# Patient Record
Sex: Male | Born: 1970 | Hispanic: Yes | Marital: Married | Smoking: Never smoker
Health system: Southern US, Community
[De-identification: ages and names within clinical notes are randomized; demographics above are authoritative.]

## PROBLEM LIST (undated history)

## (undated) DIAGNOSIS — E119 Type 2 diabetes mellitus without complications: Secondary | ICD-10-CM

## (undated) HISTORY — PX: FOREARM FRACTURE SURGERY: SHX649

---

## 2015-04-06 ENCOUNTER — Ambulatory Visit
Admission: EM | Admit: 2015-04-06 | Discharge: 2015-04-06 | Disposition: A | Discharge status: Home / Self Care | Payer: Self-pay | Attending: Family Medicine | Admitting: Family Medicine

## 2015-04-06 ENCOUNTER — Encounter: Payer: Self-pay | Admitting: *Deleted

## 2015-04-06 DIAGNOSIS — Z029 Encounter for administrative examinations, unspecified: Secondary | ICD-10-CM

## 2015-04-06 DIAGNOSIS — Z024 Encounter for examination for driving license: Secondary | ICD-10-CM

## 2015-04-06 HISTORY — DX: Type 2 diabetes mellitus without complications: E11.9

## 2015-04-06 LAB — DEPT OF TRANSP DIPSTICK, URINE (ARMC ONLY)
GLUCOSE, UA: NEGATIVE mg/dL
Protein, ur: NEGATIVE mg/dL
SPECIFIC GRAVITY, URINE: 1.025 (ref 1.005–1.030)

## 2015-04-06 NOTE — ED Provider Notes (Signed)
CSN: [id number]     Arrival date & time 04/06/15  1134 History   First MD Initiated Contact with Patient 04/06/15 1328     Chief Complaint  Patient presents with  . Commercial Driver's License Exam   (Consider location/radiation/quality/duration/timing/severity/associated sxs/prior Treatment) HPI   This a 45 year old Hispanic male speaks no English presents due to physical. 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 His entire physical exam in post physical briefing of 5 findings was performed through tele-interpreters. The first was at [facility] and a post examination with Ricki Rodriguez [phone]. During the interview the patient stated that he was on diabetes not because he was diabetic but as a preventative medication initially by his physician. He is currently listed at 500 mg twice a day with a meal. I questioned the need to take have this physical done he has no income but he was adamant that he wanted to proceed. He did not want to have blood work because the additional cost but will not must have an A1c in order to see if the metformin is working. He states that he has no other medical problems has had no surgeries.     Past Medical History  Diagnosis Date  . Diabetes mellitus without complication [facility])    Past Surgical History  Procedure Laterality Date  . Forearm fracture surgery Left    History reviewed. No pertinent family history. Social History  Substance Use Topics  . Smoking status: Never Smoker   . Smokeless tobacco: None  . Alcohol Use: No    Review of Systems  All other systems reviewed and are negative.   Allergies  Review of patient's allergies indicates no known allergies.  Home Medications   Prior to Admission medications   Medication Sig Start Date End Date Taking? Authorizing Provider  metFORMIN (GLUCOPHAGE) 500 MG tablet Take 500 mg by mouth 2 (two) times daily with a meal.    Yes Historical Provider, MD   Meds Ordered and Administered this Visit  Medications - No data to display  BP 118/76 mmHg  Pulse 78  Temp(Src) 98.5 F (36.9 C) (Oral)  Resp 16  Ht  (1.702 m)  Wt 264 lb (119.75 kg)  BMI 41.34 kg/m2  SpO2 99% No data found.   Physical Exam  ED Course  Procedures (including critical care time)  Labs Review Labs Reviewed  DEPT OF TRANSP DIPSTICK, URINE([facility] ONLY) - Abnormal; Notable for the following:    Hgb urine dipstick TRACE (*)    All other components within normal limits  HEMOGLOBIN A1C    Imaging Review No results found.   Visual Acuity Review  Right Eye Distance:      Left Eye Distance:   Bilateral Distance:    Right Eye Near:   Left Eye Near:    Bilateral Near:         MDM   1. Driver's permit physical examination    I discussed through the interpreter  to this patient that he needs to have verification that his physician is taking care of him and his diabetes is under control. I have drawn an A1c today which will not be resulted until tomorrow and because of this I have issued a 3 month certificate in order to allow him to get verification from his physician that he is under that physician's care and that his diabetes is under control. I've advised the patient  that he may consider taking up to 3 months to allow him to become employed and therefore be able to pay for the exam when he returns. He understands that there will be a certificate only for 1 year since he is on medication at the time. He fully understands the rationale behind the 3 month certificate and the need for verification that he be Evaluated for the diabetes.    Lutricia FeilWilliam P Delorice Bannister, PA-C 04/06/15 1528  Lutricia FeilWilliam P Lavayah Vita, PA-C 04/06/15 2144  Lutricia FeilWilliam P Shambria Camerer, PA-C 04/06/15 2154

## 2015-04-06 NOTE — ED Notes (Signed)
DOT physical. 

## 2015-04-07 LAB — HEMOGLOBIN A1C: Hgb A1c MFr Bld: 5.6 % (ref 4.0–6.0)

## 2018-06-22 ENCOUNTER — Other Ambulatory Visit: Payer: Self-pay

## 2018-06-22 ENCOUNTER — Encounter: Payer: Self-pay | Admitting: Emergency Medicine

## 2018-06-22 ENCOUNTER — Emergency Department: Payer: HRSA Program

## 2018-06-22 ENCOUNTER — Inpatient Hospital Stay (HOSPITAL_COMMUNITY)
Admission: AD | Admit: 2018-06-22 | Discharge: 2018-06-25 | DRG: 177 | Disposition: A | Discharge status: Home / Self Care | Payer: BC Managed Care – PPO | Source: Other Acute Inpatient Hospital | Attending: Internal Medicine | Admitting: Internal Medicine

## 2018-06-22 ENCOUNTER — Emergency Department
Admission: EM | Admit: 2018-06-22 | Discharge: 2018-06-22 | Disposition: A | Discharge status: Other Acute Inpatient Hospital | Payer: HRSA Program | Attending: Emergency Medicine | Admitting: Emergency Medicine

## 2018-06-22 DIAGNOSIS — E119 Type 2 diabetes mellitus without complications: Secondary | ICD-10-CM | POA: Insufficient documentation

## 2018-06-22 DIAGNOSIS — U071 COVID-19: Secondary | ICD-10-CM

## 2018-06-22 DIAGNOSIS — Z6841 Body Mass Index (BMI) 40.0 and over, adult: Secondary | ICD-10-CM

## 2018-06-22 DIAGNOSIS — B349 Viral infection, unspecified: Secondary | ICD-10-CM | POA: Diagnosis present

## 2018-06-22 DIAGNOSIS — E66813 Obesity, class 3: Secondary | ICD-10-CM | POA: Diagnosis present

## 2018-06-22 DIAGNOSIS — J9601 Acute respiratory failure with hypoxia: Secondary | ICD-10-CM | POA: Diagnosis present

## 2018-06-22 DIAGNOSIS — R05 Cough: Secondary | ICD-10-CM

## 2018-06-22 DIAGNOSIS — R0602 Shortness of breath: Secondary | ICD-10-CM

## 2018-06-22 DIAGNOSIS — T380X5A Adverse effect of glucocorticoids and synthetic analogues, initial encounter: Secondary | ICD-10-CM | POA: Diagnosis present

## 2018-06-22 DIAGNOSIS — R740 Nonspecific elevation of levels of transaminase and lactic acid dehydrogenase [LDH]: Secondary | ICD-10-CM | POA: Diagnosis present

## 2018-06-22 DIAGNOSIS — R058 Other specified cough: Secondary | ICD-10-CM

## 2018-06-22 DIAGNOSIS — J069 Acute upper respiratory infection, unspecified: Secondary | ICD-10-CM | POA: Diagnosis not present

## 2018-06-22 DIAGNOSIS — R5383 Other fatigue: Secondary | ICD-10-CM

## 2018-06-22 DIAGNOSIS — R509 Fever, unspecified: Secondary | ICD-10-CM

## 2018-06-22 DIAGNOSIS — Z7984 Long term (current) use of oral hypoglycemic drugs: Secondary | ICD-10-CM | POA: Insufficient documentation

## 2018-06-22 LAB — CBC
HCT: 47.8 % (ref 39.0–52.0)
Hemoglobin: 16.4 g/dL (ref 13.0–17.0)
MCH: 28.9 pg (ref 26.0–34.0)
MCHC: 34.3 g/dL (ref 30.0–36.0)
MCV: 84.3 fL (ref 80.0–100.0)
Platelets: 226 10*3/uL (ref 150–400)
RBC: 5.67 MIL/uL (ref 4.22–5.81)
RDW: 12 % (ref 11.5–15.5)
WBC: 5.5 10*3/uL (ref 4.0–10.5)
nRBC: 0 % (ref 0.0–0.2)

## 2018-06-22 LAB — BASIC METABOLIC PANEL
Anion gap: 13 (ref 5–15)
BUN: 13 mg/dL (ref 6–20)
CO2: 20 mmol/L — ABNORMAL LOW (ref 22–32)
Calcium: 8.5 mg/dL — ABNORMAL LOW (ref 8.9–10.3)
Chloride: 101 mmol/L (ref 98–111)
Creatinine, Ser: 0.84 mg/dL (ref 0.61–1.24)
GFR calc Af Amer: >60 mL/min (ref >60)
GFR calc non Af Amer: >60 mL/min (ref >60)
Glucose, Bld: 199 mg/dL — ABNORMAL HIGH (ref 70–99)
Potassium: 3.7 mmol/L (ref 3.5–5.1)
Sodium: 134 mmol/L — ABNORMAL LOW (ref 135–145)

## 2018-06-22 LAB — GLUCOSE, CAPILLARY: Glucose-Capillary: 165 mg/dL — ABNORMAL HIGH (ref 70–99)

## 2018-06-22 LAB — PROCALCITONIN: Procalcitonin: <0.1 ng/mL

## 2018-06-22 LAB — LACTATE DEHYDROGENASE: LDH: 256 U/L — ABNORMAL HIGH (ref 98–192)

## 2018-06-22 LAB — SARS CORONAVIRUS 2 BY RT PCR (HOSPITAL ORDER, PERFORMED IN ~~LOC~~ HOSPITAL LAB): SARS Coronavirus 2: POSITIVE — AB

## 2018-06-22 LAB — FERRITIN: Ferritin: 1415 ng/mL — ABNORMAL HIGH (ref 24–336)

## 2018-06-22 LAB — C-REACTIVE PROTEIN: CRP: 8.7 mg/dL — ABNORMAL HIGH (ref <1.0)

## 2018-06-22 LAB — FIBRIN DERIVATIVES D-DIMER (ARMC ONLY): Fibrin derivatives D-dimer (ARMC): 511.97 ng/mL (FEU) — ABNORMAL HIGH (ref 0.00–499.00)

## 2018-06-22 MED ORDER — INSULIN ASPART 100 UNIT/ML ~~LOC~~ SOLN
0.0000 [IU] | Freq: Three times a day (TID) | SUBCUTANEOUS | Status: DC
  Administered 2018-06-23 (×3): 5 [IU] via SUBCUTANEOUS
  Administered 2018-06-24 (×2): 7 [IU] via SUBCUTANEOUS
  Administered 2018-06-24 – 2018-06-25 (×2): 3 [IU] via SUBCUTANEOUS

## 2018-06-22 MED ORDER — INSULIN ASPART 100 UNIT/ML ~~LOC~~ SOLN
0.0000 [IU] | Freq: Every day | SUBCUTANEOUS | Status: DC
  Administered 2018-06-23 – 2018-06-24 (×2): 3 [IU] via SUBCUTANEOUS

## 2018-06-22 MED ORDER — POLYETHYLENE GLYCOL 3350 17 G PO PACK: 17.0000 g | PACK | Freq: Every day | ORAL | Status: DC | PRN

## 2018-06-22 MED ORDER — ACETAMINOPHEN 500 MG PO TABS
1000.0000 mg | ORAL_TABLET | Freq: Once | ORAL | Status: AC
  Administered 2018-06-22: 1000 mg via ORAL
  Filled 2018-06-22: qty 2

## 2018-06-22 MED ORDER — ONDANSETRON HCL 4 MG/2ML IJ SOLN: 4.0000 mg | Freq: Four times a day (QID) | INTRAMUSCULAR | Status: DC | PRN

## 2018-06-22 MED ORDER — ACETAMINOPHEN 325 MG PO TABS: 650.0000 mg | ORAL_TABLET | Freq: Four times a day (QID) | ORAL | Status: DC | PRN

## 2018-06-22 MED ORDER — ENOXAPARIN SODIUM 120 MG/0.8ML ~~LOC~~ SOLN
120.0000 mg | Freq: Two times a day (BID) | SUBCUTANEOUS | Status: DC
  Administered 2018-06-22 – 2018-06-23 (×2): 120 mg via SUBCUTANEOUS
  Filled 2018-06-22 (×2): qty 0.8

## 2018-06-22 MED ORDER — SODIUM CHLORIDE 0.9 % IV BOLUS
500.0000 mL | Freq: Once | INTRAVENOUS | Status: AC
  Administered 2018-06-22: 13:00:00 500 mL via INTRAVENOUS

## 2018-06-22 MED ORDER — ONDANSETRON HCL 4 MG PO TABS: 4.0000 mg | ORAL_TABLET | Freq: Four times a day (QID) | ORAL | Status: DC | PRN

## 2018-06-22 NOTE — ED Provider Notes (Signed)
[facility] EMERGENCY DEPARTMENT Provider Note   CSN: [id number] Arrival date & time: 06/22/18  1210    History   Chief Complaint Chief Complaint  Patient presents with  . Weakness  . Shortness of Breath    HPI Brian Robertson is a 48 y.o. male presents to the emergency department evaluation of persistent fever cough and difficulty breathing.  Patient states he was diagnosed with COVID 16 days ago.  His wife and son were also diagnosed with COVID and their symptoms have resolved.  He is concerned because he is continuing to have symptoms.  Patient has had persistent subjective fevers.  He is taken Tylenol and ibuprofen last dose of medication was last night he took Tylenol around 11 PM.  Patient has had dry nonproductive cough, denies any wheezing.  He denies any abdominal pain, chest pain.  Patient's cough shortness of breath seem to be increasing. HPI  Past Medical History:  Diagnosis Date  . Diabetes mellitus without complication (HCC)     There are no active problems to display for this patient.   Past Surgical History:  Procedure Laterality Date  . FOREARM FRACTURE SURGERY Left         Home Medications    Prior to Admission medications   Medication Sig Start Date End Date Taking? Authorizing Provider  metFORMIN (GLUCOPHAGE) 500 MG tablet Take 500 mg by mouth 2 (two) times daily with a meal.    [provider]    Family History History reviewed. No pertinent family history.  Social History Social History   Tobacco Use  . Smoking status: Never Smoker  . Smokeless tobacco: Never Used  Substance Use Topics  . Alcohol use: No  . Drug use: Never     Allergies   Patient has no known allergies.   Review of Systems Review of Systems  Constitutional: Positive for fatigue and fever.  HENT: Negative for sore throat and trouble swallowing.   Respiratory: Positive for cough and shortness of breath. Negative for chest  tightness and wheezing.   Cardiovascular: Negative for chest pain.  Gastrointestinal: Negative for abdominal pain, diarrhea, nausea and vomiting.  Genitourinary: Negative for dysuria.  Musculoskeletal: Negative for arthralgias, back pain, gait problem and joint swelling.  Skin: Negative for rash and wound.  Neurological: Negative for dizziness, light-headedness and headaches.     Physical Exam Updated Vital Signs BP 114/69   Pulse (!) 101   Temp (!) 100.7 F (38.2 C) (Oral)   Resp 18   Ht 5\' 7"  (1.702 m)   Wt 124.7 kg   SpO2 94%   BMI 43.07 kg/m   Physical Exam Constitutional:      Appearance: He is well-developed.  HENT:     Head: Normocephalic and atraumatic.  Eyes:     Conjunctiva/sclera: Conjunctivae normal.  Neck:     Musculoskeletal: Normal range of motion.  Cardiovascular:     Rate and Rhythm: Tachycardia present.     Pulses: Normal pulses.     Heart sounds: Normal heart sounds.  Pulmonary:     Effort: Pulmonary effort is normal. Tachypnea present.     Breath sounds: Examination of the right-upper field reveals decreased breath sounds. Examination of the left-upper field reveals decreased breath sounds. Examination of the right-middle field reveals decreased breath sounds. Examination of the left-middle field reveals decreased breath sounds. Examination of the right-lower field reveals decreased breath sounds. Examination of the left-lower field reveals decreased breath sounds. Decreased breath sounds present.  No wheezing, rhonchi or rales.  Chest:     Chest wall: No tenderness.  Abdominal:     General: There is no distension.     Tenderness: There is no abdominal tenderness. There is no guarding.  Musculoskeletal: Normal range of motion.  Skin:    General: Skin is warm.     Findings: No rash.  Neurological:     Mental Status: He is alert and oriented to person, place, and time.  Psychiatric:        Behavior: Behavior normal.        Thought Content: Thought  content normal.      ED Treatments / Results  Labs (all labs ordered are listed, but only abnormal results are displayed) Labs Reviewed  SARS CORONAVIRUS 2 (HOSPITAL ORDER, PERFORMED IN [facility]) - Abnormal; Notable for the following components:      Result Value   SARS Coronavirus 2 POSITIVE (*)    All other components within normal limits  BASIC METABOLIC PANEL - Abnormal; Notable for the following components:   Sodium 134 (*)    CO2 20 (*)    Glucose, Bld 199 (*)    Calcium 8.5 (*)    All other components within normal limits  CBC  PROCALCITONIN    EKG None  Radiology Dg Chest 1 View  Result Date: 06/22/2018 CLINICAL DATA:  Difficulty breathing, shortness of breath EXAM: CHEST  1 VIEW COMPARISON:  None. FINDINGS: Low lung volumes with mid and lower lung ill-defined and streaky mixed interstitial/airspace opacities. Minor right base atelectasis. Negative for edema, effusion or pneumothorax. Trachea midline. No significant or acute osseous finding. IMPRESSION: Bilateral mid and lower lung streaky mixed interstitial and airspace opacities compatible with pneumonia/atypical infection. Right base atelectasis. Electronically Signed   By: Judie PetitM.  Shick M.D.   On: 06/22/2018 13:41    Procedures Procedures (including critical care time)  Medications Ordered in ED Medications  acetaminophen (TYLENOL) tablet 1,000 mg (1,000 mg Oral Given 06/22/18 1317)  sodium chloride 0.9 % bolus 500 mL (0 mLs Intravenous Stopped 06/22/18 1443)     Initial Impression / Assessment and Plan / ED Course  I have reviewed the triage vital signs and the nursing notes.  Pertinent labs & imaging results that were available during my care of the patient were reviewed by me and considered in my medical decision making (see chart for details).        48 year old male with positive coded who states he was +16 days ago as well.  He has had persistent cough shortness of breath with fevers.   Patient hypoxic with ambulation.  X-ray showing bilateral diffuse infiltrates.  [facility] and [facility] for observation of hypoxia secondary to COVID  Final Clinical Impressions(s) / ED Diagnoses   Final diagnoses:  COVID-19 virus infection  Fatigue, unspecified type  Fever, unspecified fever cause  Dry cough    ED Discharge Orders    None       Ronnette JuniperGaines, Acxel Dingee C, PA-C 06/22/18 1714    Sharyn CreamerQuale, Mark, MD 07/02/18 2344

## 2018-06-22 NOTE — ED Notes (Signed)
Date and time results received: 06/22/18 2:39 PM   Test: Covid Critical Value: Pos  Name of Provider Notified: Floyce Stakes, [place]

## 2018-06-22 NOTE — ED Notes (Signed)
This tech ambulated with pt in room. While ambulating pts SpO2 decreased to 91%, pt began coughing and wheezing. Pt returned to bed. Will continue to monitor.

## 2018-06-22 NOTE — ED Provider Notes (Signed)
Medical screening examination/treatment/procedure(s) were conducted as a shared visit with non-physician practitioner(s) and myself.  I personally evaluated the patient during the encounter.    Personally saw and evaluate the patient.  He is slightly tachycardic at rest.  Febrile.  With ambulation his pulse oximetry decreases to 91%.  He has some mild tachypnea when at rest, no wheezing.  I am concerned that the patient has bilateral infiltrates, slight tachycardia at rest, developing hypoxia with ambulation.  Discussed with the patient via the interpreter, also saw the patient myself, staffed also with our PA Cranston Neighbor.  We will consult with the [facility] hospitalist, plan for admission to [facility] for COVID-19 is the patient status seems to be ongoing will now developing borderline hypoxia, tachycardia, and I am concerned that he could develop significant hypoxia over the next few days time.  Ongoing care assigned to Dr. Scotty Court and also continuing care with PA Evalee Mutton, Loraine Leriche, MD 06/22/18 [phone]

## 2018-06-22 NOTE — ED Triage Notes (Signed)
Pt to ER from home with reports of positive Covid last week.  Reports he is having increased difficulty breathing.  States symptoms are much the same as the last several days.

## 2018-06-22 NOTE — ED Notes (Signed)
emtala reviewed by this RN 

## 2018-06-23 LAB — RESPIRATORY PANEL BY PCR

## 2018-06-23 LAB — HEPATIC FUNCTION PANEL
ALT: 95 U/L — ABNORMAL HIGH (ref 0–44)
AST: 54 U/L — ABNORMAL HIGH (ref 15–41)
Albumin: 3.3 g/dL — ABNORMAL LOW (ref 3.5–5.0)
Alkaline Phosphatase: 67 U/L (ref 38–126)
Bilirubin, Direct: 0.2 mg/dL (ref 0.0–0.2)
Indirect Bilirubin: 0.3 mg/dL (ref 0.3–0.9)
Total Bilirubin: 0.5 mg/dL (ref 0.3–1.2)
Total Protein: 7.3 g/dL (ref 6.5–8.1)

## 2018-06-23 LAB — ABO/RH: ABO/RH(D): O POS

## 2018-06-23 LAB — GLUCOSE, CAPILLARY
Glucose-Capillary: 259 mg/dL — ABNORMAL HIGH (ref 70–99)
Glucose-Capillary: 298 mg/dL — ABNORMAL HIGH (ref 70–99)
Glucose-Capillary: 287 mg/dL — ABNORMAL HIGH (ref 70–99)
Glucose-Capillary: 274 mg/dL — ABNORMAL HIGH (ref 70–99)

## 2018-06-23 LAB — D-DIMER, QUANTITATIVE: D-Dimer, Quant: 0.27 ug/mL-FEU (ref 0.00–0.50)

## 2018-06-23 LAB — HEMOGLOBIN A1C
Hgb A1c MFr Bld: 6.9 % — ABNORMAL HIGH (ref 4.8–5.6)
Mean Plasma Glucose: 151.33 mg/dL

## 2018-06-23 MED ORDER — ZINC SULFATE 220 (50 ZN) MG PO CAPS
220.0000 mg | ORAL_CAPSULE | Freq: Every day | ORAL | Status: DC
  Administered 2018-06-23 – 2018-06-25 (×3): 220 mg via ORAL
  Filled 2018-06-23 (×3): qty 1

## 2018-06-23 MED ORDER — BENZONATATE 100 MG PO CAPS
100.0000 mg | ORAL_CAPSULE | Freq: Three times a day (TID) | ORAL | Status: DC
  Administered 2018-06-23 – 2018-06-25 (×7): 100 mg via ORAL
  Filled 2018-06-23 (×7): qty 1

## 2018-06-23 MED ORDER — TOCILIZUMAB 400 MG/20ML IV SOLN
800.0000 mg | Freq: Once | INTRAVENOUS | Status: AC
  Administered 2018-06-23: 800 mg via INTRAVENOUS
  Filled 2018-06-23: qty 40

## 2018-06-23 MED ORDER — GUAIFENESIN 100 MG/5ML PO SOLN
5.0000 mL | ORAL | Status: DC | PRN
  Administered 2018-06-24: 100 mg via ORAL
  Filled 2018-06-23: qty 15

## 2018-06-23 MED ORDER — VITAMIN C 500 MG PO TABS
500.0000 mg | ORAL_TABLET | Freq: Every day | ORAL | Status: DC
  Administered 2018-06-23 – 2018-06-25 (×3): 500 mg via ORAL
  Filled 2018-06-23 (×3): qty 1

## 2018-06-23 MED ORDER — ENOXAPARIN SODIUM 60 MG/0.6ML ~~LOC~~ SOLN
60.0000 mg | SUBCUTANEOUS | Status: DC
  Administered 2018-06-24 – 2018-06-25 (×2): 60 mg via SUBCUTANEOUS
  Filled 2018-06-23 (×2): qty 0.6

## 2018-06-23 MED ORDER — METHYLPREDNISOLONE SODIUM SUCC 125 MG IJ SOLR
80.0000 mg | Freq: Three times a day (TID) | INTRAMUSCULAR | Status: DC
  Administered 2018-06-23 – 2018-06-24 (×5): 80 mg via INTRAVENOUS
  Filled 2018-06-23 (×5): qty 2

## 2018-06-23 MED ORDER — FAMOTIDINE 20 MG PO TABS
20.0000 mg | ORAL_TABLET | Freq: Every day | ORAL | Status: DC
  Administered 2018-06-23 – 2018-06-25 (×3): 20 mg via ORAL
  Filled 2018-06-23 (×3): qty 1

## 2018-06-23 NOTE — Progress Notes (Signed)
PROGRESS NOTE  Brian Robertson MMC:[id number] DOB: August 15, 1970 DOA: 06/22/2018  [facility]  Brief History/Interval Summary: 48 y.o. male with medical history significant for morbid obesity and recently diagnosed diabetes mellitus (who had not yet started on medication) who was diagnosed over a week ago with coronavirus along with the rest of his family.    He initially was thought to be improving but then his symptoms worsened he came to the emergency department.  He was noted to be mildly hypoxic.  He was hospitalized for further management.   Reason for Visit: Acute respiratory disease due to COVID-19  Consultants: None  Procedures: None  Antibiotics: None  Subjective/Interval History: Interpreter services were utilized to communicate with the patient.  Patient states that he continues to have a cough.  Continues to have shortness of breath though slightly better today compared to yesterday.  Denies any nausea vomiting.    Assessment/Plan:  Acute Hypoxic Resp. Failure due to Acute Covid 19 Viral Illness  COVID-19 Labs  Recent Labs    06/22/18 1737 06/23/18 0324  DDIMER  --  0.27  FERRITIN 1,415*  --   LDH 256*  --   CRP 8.7*  --     Lab Results  Component Value Date   SARSCOV2NAA POSITIVE (A) 06/22/2018     Fever: Had fever yesterday with a temperature of 100.7.  Afebrile since then. Oxygen requirements: 2 L of oxygen by nasal cannula.  Saturating in the 90s. Antibiotics: None Remdesivir: Not indicated due to prolonged course of illness Steroids: Solu-Medrol 80 mg every 8 hours Diuretics: Not ordered yet Actemra: Received 1 dose on 5/30. Convalescent Plasma: Not given yet Vitamin C and Zinc: Will be initiated DVT Prophylaxis:  Lovenox 60 mg once daily  Patient's respiratory status appears to be stable.  His CRP was 8.7 yesterday evening.  He was given a dose of Actemra and placed on steroids.  We will recheck his inflammatory  markers tomorrow.  Hold off on further doses of Actemra for now.  Continue steroids.  Incentive spirometry.  Prone positioning as much as possible.  Mobilization.  The treatment plan and use of medications and known side effects were discussed with patient. It was clearly explained that there is no proven definitive treatment for COVID-19 infection yet. Any medications used here are based on case reports/anecdotal data which are not peer-reviewed and has not been studied using randomized control trials.  Complete risks and long-term side effects are unknown, however in the best clinical judgment they seem to be of some clinical benefit rather than medical risks.  Patient agreed with the treatment plan and want to receive these treatments as indicated.  She was given steroids and Actemra.  Diabetes mellitus type 2 Recently diagnosed.  Has not started taking metformin yet.  HbA1c is pending.  SSI.  Morbid obesity BMI 43.07.   DVT Prophylaxis: Lovenox PUD Prophylaxis: Will initiate Pepcid Code Status: Full code Family Communication: Discussed with the patient Disposition Plan: Management as outlined above.  Will likely return home when improved.   Medications:  Scheduled: . [START ON 06/24/2018] enoxaparin (LOVENOX) injection  60 mg Subcutaneous Q24H  . insulin aspart  0-5 Units Subcutaneous QHS  . insulin aspart  0-9 Units Subcutaneous TID WC  . methylPREDNISolone (SOLU-MEDROL) injection  80 mg Intravenous Q8H   Continuous:  PCH:EKBTCYELYHTMB, ondansetron **OR** ondansetron (ZOFRAN) IV, polyethylene glycol   Objective:  Vital Signs  Vitals:   06/23/18 3112 06/23/18 1624 06/23/18 0750  06/23/18 0812  BP: (!) 134/97 (!) 123/94    Pulse: 97 90 95   Resp: (!) 33 (!) 24 (!) 25   Temp: 99.4 F (37.4 C) 98.2 F (36.8 C)  98.6 F (37 C)  TempSrc: Oral Oral  Oral  SpO2:   94%     Intake/Output Summary (Last 24 hours) at 06/23/2018 1132 Last data filed at 06/23/2018 0750 Gross per 24  hour  Intake 100 ml  Output 750 ml  Net -650 ml   There were no vitals filed for this visit.  General appearance: Awake alert.  In no distress.  Morbidly obese Resp: Mildly tachypneic at rest.  Crackles at the bases.  No wheezing or rhonchi. Cardio: S1-S2 is normal regular.  No S3-S4.  No rubs murmurs or bruit GI: Abdomen is soft.  Nontender nondistended.  Bowel sounds are present normal.  No masses organomegaly Extremities: No edema.  Full range of motion of lower extremities. Neurologic: Alert and oriented x3.  No focal neurological deficits.    Lab Results:  Data Reviewed: I have personally reviewed following labs and imaging studies  CBC: Recent Labs  Lab 06/22/18 1241  WBC 5.5  HGB 16.4  HCT 47.8  MCV 84.3  PLT 226    Basic Metabolic Panel: Recent Labs  Lab 06/22/18 1241  NA 134*  K 3.7  CL 101  CO2 20*  GLUCOSE 199*  BUN 13  CREATININE 0.84  CALCIUM 8.5*    GFR: Estimated Creatinine Clearance: 137.6 mL/min (by C-G formula based on SCr of 0.84 mg/dL).  Liver Function Tests: Recent Labs  Lab 06/23/18 0324  AST 54*  ALT 95*  ALKPHOS 67  BILITOT 0.5  PROT 7.3  ALBUMIN 3.3*    CBG: Recent Labs  Lab 06/22/18 2231 06/23/18 0807  GLUCAP 165* 259*    Anemia Panel: Recent Labs    06/22/18 1737  FERRITIN 1,415*    Recent Results (from the past 240 hour(s))  SARS Coronavirus 2 (CEPHEID- Performed in [facility] hospital lab), Hosp Order     Status: Abnormal   Collection Time: 06/22/18  1:19 PM  Result Value Ref Range Status   SARS Coronavirus 2 POSITIVE (A) NEGATIVE Final    Comment: RESULT CALLED TO, READ BACK BY AND VERIFIED WITH: JENNIFER WHITLEY AT 1425 ON 06/22/2018 [facility]. (NOTE) If result is NEGATIVE SARS-CoV-2 target nucleic acids are NOT DETECTED. The SARS-CoV-2 RNA is generally detectable in upper and lower  respiratory specimens during the acute phase of infection. The lowest  concentration of SARS-CoV-2 viral copies this assay  can detect is 250  copies / mL. A negative result does not preclude SARS-CoV-2 infection  and should not be used as the sole basis for treatment or other  patient management decisions.  A negative result may occur with  improper specimen collection / handling, submission of specimen other  than nasopharyngeal swab, presence of viral mutation(s) within the  areas targeted by this assay, and inadequate number of viral copies  (<250 copies / mL). A negative result must be combined with clinical  observations, patient history, and epidemiological information. If result is POSITIVE SARS-CoV-2 target nucleic acids are DETEC TED. The SARS-CoV-2 RNA is generally detectable in upper and lower  respiratory specimens during the acute phase of infection.  Positive  results are indicative of active infection with SARS-CoV-2.  Clinical  correlation with patient history and other diagnostic information is  necessary to determine patient infection status.  Positive results do  not rule out bacterial infection or co-infection with other viruses. If result is PRESUMPTIVE POSTIVE SARS-CoV-2 nucleic acids MAY BE PRESENT.   A presumptive positive result was obtained on the submitted specimen  and confirmed on repeat testing.  While 2019 novel coronavirus  (SARS-CoV-2) nucleic acids may be present in the submitted sample  additional confirmatory testing may be necessary for epidemiological  and / or clinical management purposes  to differentiate between  SARS-CoV-2 and other Sarbecovirus currently known to infect humans.  If clinically indicated additional testing with an alternate test  methodology (LAB7 453) is advised. The SARS-CoV-2 RNA is generally  detectable in upper and lower respiratory specimens during the acute  phase of infection. The expected result is Negative. Fact Sheet for Patients:  BoilerBrush.com.cyhttps://www.fda.gov/media/136312/download Fact Sheet for Healthcare  Providers: https://pope.com/https://www.fda.gov/media/136313/download This test is not yet approved or cleared by the [place] FDA and has been authorized for detection and/or diagnosis of SARS-CoV-2 by FDA under an Emergency Use Authorization (EUA).  This EUA will remain in effect (meaning this test can be used) for the duration of the COVID-19 declaration under Section 564(b)(1) of the Act, 21 U.S.C. section 360bbb-3(b)(1), unless the authorization is terminated or revoked sooner. Performed at [facility], [address] [place], [place] [address]       Radiology Studies: Dg Chest 1 View  Result Date: 06/22/2018 CLINICAL DATA:  Difficulty breathing, shortness of breath EXAM: CHEST  1 VIEW COMPARISON:  None. FINDINGS: Low lung volumes with mid and lower lung ill-defined and streaky mixed interstitial/airspace opacities. Minor right base atelectasis. Negative for edema, effusion or pneumothorax. Trachea midline. No significant or acute osseous finding. IMPRESSION: Bilateral mid and lower lung streaky mixed interstitial and airspace opacities compatible with pneumonia/atypical infection. Right base atelectasis. Electronically Signed   By: Judie PetitM.  Shick M.D.   On: 06/22/2018 13:41       LOS: 1 day   Brian Robertson  [facility] Pager on [email or web address]  06/23/2018, 11:32 AM

## 2018-06-23 NOTE — Progress Notes (Signed)
I attempted to update the pt's spouse, but her phone was turned off. I left a voicemail.

## 2018-06-23 NOTE — TOC Initial Note (Addendum)
Transition of Care [facility]) - Initial/Assessment Note    Patient Details  Name: Brian Robertson MRN: [id number] Date of Birth: 1971-01-09  [facility] CM/SW Contact:    Durenda Guthrie, RN Phone Number: [phone] (working remotely) 06/23/2018, 1:16 PM  Clinical Narrative:     5/31:48 yr old male COVID 19+, uninsured, speaks spanish, from home and several family members with COVID recovered, he did not.. Pt goes to [facility]. CMA will schedule follow Up appt for patient. CM will continue to monitor for appropriate discharge plan as patient improves. May God Bless him to do so.        Patient Goals and CMS Choice        Expected Discharge Plan and Services                                                Prior Living Arrangements/Services                       Activities of Daily Living      Permission Sought/Granted                  Emotional Assessment              Admission diagnosis:  COVID-19 Patient Active Problem List   Diagnosis Date Noted  . Diabetes mellitus without complication (HCC) 06/22/2018  . Acute respiratory disease due to COVID-19 virus 06/22/2018  . Acute respiratory failure with hypoxia (HCC) 06/22/2018  . Obesity, Class III, BMI 40-49.9 (morbid obesity) (HCC) 06/22/2018   [facility]P:  [facility]:   [id number] Dan Humphreys, [place] - [address] AT [facility] [address] [facility] [place] [address] Phone: [phone] Fax: [phone]     Social Determinants of Health (SDOH) Interventions    Readmission Risk Interventions No flowsheet data found.

## 2018-06-23 NOTE — H&P (Signed)
History and Physical  Brian BandyJose Mario Robertson ZOX:[id number] DOB: Sep 27, 1970 DOA: 06/22/2018  Referring physician: Cranston Neighborhris Gaines, ER PA [place] PCP: [facility]  Outpatient Specialists: None Patient coming from: Home & is able to ambulate without assistance  Chief Complaint: Shortness of breath and cough  Please note that patient speaks very little [place].  Medical Spanish translator used for patient interview  HPI: Brian BandyJose Mario Robertson is a 48 y.o. male with medical history significant for morbid obesity and recently diagnosed diabetes mellitus (who had not yet started on medication) who was diagnosed over a week ago with coronavirus along with the rest of his family.  His family seem to overall recovered, the patient himself noted that his breathing and nonproductive cough appeared to worsen to the point where he came into the emergency room today for further evaluation  ED Course: In the emergency room, patient noted to be mildly hypoxic requiring 2 L nasal cannula.  Chest x-ray noted bilateral infiltrates patient was tachycardic at rest.  Lab work noteworthy for a normal procalcitonin, but elevated CRP at 8.7, ferritin at 1400 and LDH of 256.  CBGs elevated at 199.  On 2 L nasal cannula, oxygen saturations at 94%.  Hospitalist were called for further evaluation and patient transported to [facility].  Review of Systems: Patient seen after arrival to floor. Pt complains of some shortness of breath, nonproductive cough and lightheadedness when standing or even talking.  Says he feels better when he is laying down.  Pt denies any headaches, vision changes, dysphagia, chest pain, palpitations, wheezing, abdominal pain, hematuria, dysuria, constipation, diarrhea, focal extremity numbness weakness or pain.  Review of systems are otherwise negative   Past Medical History:  Diagnosis Date  . Diabetes mellitus without complication [facility], [facility], [facility](HCC)    Past Surgical History:   Procedure Laterality Date  . FOREARM FRACTURE SURGERY Left     Social History:  reports that he has never smoked. He has never used smokeless tobacco. He reports that he does not drink alcohol or use drugs.  Ambulates without assistance, lives at home with family   No Known Allergies  Family history: Patient's son recently diagnosed diabetes several months ago  Prior to Admission medications   Medication Sig Start Date End Date Taking? Authorizing Provider  metFORMIN (GLUCOPHAGE) 500 MG tablet Take 500 mg by mouth 2 (two) times daily with a meal.    [provider]    Physical Exam: BP (!) 134/97 (BP Location: Left Arm)   Pulse 97   Temp 99.4 F (37.4 C) (Oral)   Resp (!) 33   General: Alert and oriented x3, no acute distress Eyes: Sclera nonicteric, extraocular movements are intact ENT: Normocephalic and atraumatic, mucous membranes are moist Neck: Thick, narrow airway Cardiovascular: Regular rate and rhythm, S1-S2 Respiratory: Clear to auscultation bilaterally Abdomen: Soft, obese, nontender, positive bowel sounds Skin: Tanned skin, no skin breaks, tears or lesions Musculoskeletal: No clubbing or cyanosis or edema Psychiatric: Patient is appropriate, no evidence of psychoses Neurologic: No focal deficits          Labs on Admission:  Basic Metabolic Panel: Recent Labs  Lab 06/22/18 1241  NA 134*  K 3.7  CL 101  CO2 20*  GLUCOSE 199*  BUN 13  CREATININE 0.84  CALCIUM 8.5*   Liver Function Tests: No results for input(s): AST, ALT, ALKPHOS, BILITOT, PROT, ALBUMIN in the last 168 hours. No results for input(s): LIPASE, AMYLASE in the last 168 hours. No results  for input(s): AMMONIA in the last 168 hours. CBC: Recent Labs  Lab 06/22/18 1241  WBC 5.5  HGB 16.4  HCT 47.8  MCV 84.3  PLT 226   Cardiac Enzymes: No results for input(s): CKTOTAL, CKMB, CKMBINDEX, TROPONINI in the last 168 hours.  BNP (last 3 results) No results for input(s): BNP in  the last 8760 hours.  ProBNP (last 3 results) No results for input(s): PROBNP in the last 8760 hours.  CBG: Recent Labs  Lab 06/22/18 2231  GLUCAP 165*    Radiological Exams on Admission: Dg Chest 1 View  Result Date: 06/22/2018 CLINICAL DATA:  Difficulty breathing, shortness of breath EXAM: CHEST  1 VIEW COMPARISON:  None. FINDINGS: Low lung volumes with mid and lower lung ill-defined and streaky mixed interstitial/airspace opacities. Minor right base atelectasis. Negative for edema, effusion or pneumothorax. Trachea midline. No significant or acute osseous finding. IMPRESSION: Bilateral mid and lower lung streaky mixed interstitial and airspace opacities compatible with pneumonia/atypical infection. Right base atelectasis. Electronically Signed   By: Judie Petit.  Shick M.D.   On: 06/22/2018 13:41    EKG: Independently reviewed.  Sinus tachycardia  Assessment/Plan Present on Admission: . Acute respiratory disease due to COVID-19 virus . Acute respiratory failure with hypoxia (HCC) . Obesity (BMI 30-39.9)  Active Problems:   Diabetes mellitus without complication (HCC): New diagnosis in the past few weeks with the patient.  He was prescribed metformin, but has yet to start taking it.  Will check A1c.  Cover with sliding scale.    Acute respiratory disease due to COVID-19 virus causing Acute respiratory failure with hypoxia (HCC): Treat with IV steroids, Actemra.  CRP level 8.7.  Checking respiratory panel.  Morbid obesity: Patient with criteria BMI greater than 40   DVT prophylaxis: Lovenox, adjusted for coronavirus  Code Status: Full code as confirmed by patient  Family Communication: We will update patient's family in the morning  Disposition Plan: Patient will likely be here for several days until his oxygenation improves  Consults called: None  Admission status: Given need for acute hospital services past 2 midnights, admit as inpatient    Hollice Espy MD [facility] Pager [phone]  If 7PM-7AM, please contact night-coverage [email or web address] Password [email or web address]  06/23/2018, 12:52 AM

## 2018-06-23 NOTE — Plan of Care (Signed)
  Problem: Nutrition: Goal: Adequate nutrition will be maintained Outcome: Progressing   Problem: Coping: Goal: Level of anxiety will decrease Outcome: Progressing   Problem: Pain Managment: Goal: General experience of comfort will improve Outcome: Progressing   Problem: Safety: Goal: Ability to remain free from injury will improve Outcome: Progressing   

## 2018-06-24 DIAGNOSIS — J9601 Acute respiratory failure with hypoxia: Secondary | ICD-10-CM

## 2018-06-24 DIAGNOSIS — R740 Nonspecific elevation of levels of transaminase and lactic acid dehydrogenase [LDH]: Secondary | ICD-10-CM

## 2018-06-24 DIAGNOSIS — E119 Type 2 diabetes mellitus without complications: Secondary | ICD-10-CM

## 2018-06-24 LAB — COMPREHENSIVE METABOLIC PANEL
ALT: 140 U/L — ABNORMAL HIGH (ref 0–44)
AST: 47 U/L — ABNORMAL HIGH (ref 15–41)
Albumin: 3.2 g/dL — ABNORMAL LOW (ref 3.5–5.0)
Alkaline Phosphatase: 73 U/L (ref 38–126)
Anion gap: 12 (ref 5–15)
BUN: 22 mg/dL — ABNORMAL HIGH (ref 6–20)
CO2: 21 mmol/L — ABNORMAL LOW (ref 22–32)
Calcium: 8.8 mg/dL — ABNORMAL LOW (ref 8.9–10.3)
Chloride: 101 mmol/L (ref 98–111)
Creatinine, Ser: 0.9 mg/dL (ref 0.61–1.24)
GFR calc Af Amer: >60 mL/min (ref >60)
GFR calc non Af Amer: >60 mL/min (ref >60)
Glucose, Bld: 231 mg/dL — ABNORMAL HIGH (ref 70–99)
Potassium: 4.2 mmol/L (ref 3.5–5.1)
Sodium: 134 mmol/L — ABNORMAL LOW (ref 135–145)
Total Bilirubin: 0.4 mg/dL (ref 0.3–1.2)
Total Protein: 7.1 g/dL (ref 6.5–8.1)

## 2018-06-24 LAB — CBC WITH DIFFERENTIAL/PLATELET
Abs Immature Granulocytes: 0.06 10*3/uL (ref 0.00–0.07)
Basophils Absolute: 0 10*3/uL (ref 0.0–0.1)
Basophils Relative: 0 %
Eosinophils Absolute: 0 10*3/uL (ref 0.0–0.5)
Eosinophils Relative: 0 %
HCT: 46.1 % (ref 39.0–52.0)
Hemoglobin: 15.3 g/dL (ref 13.0–17.0)
Immature Granulocytes: 1 %
Lymphocytes Relative: 9 %
Lymphs Abs: 0.7 10*3/uL (ref 0.7–4.0)
MCH: 28.7 pg (ref 26.0–34.0)
MCHC: 33.2 g/dL (ref 30.0–36.0)
MCV: 86.5 fL (ref 80.0–100.0)
Monocytes Absolute: 0.6 10*3/uL (ref 0.1–1.0)
Monocytes Relative: 7 %
Neutro Abs: 6.9 10*3/uL (ref 1.7–7.7)
Neutrophils Relative %: 83 %
Platelets: 331 10*3/uL (ref 150–400)
RBC: 5.33 MIL/uL (ref 4.22–5.81)
RDW: 12 % (ref 11.5–15.5)
WBC: 8.3 10*3/uL (ref 4.0–10.5)
nRBC: 0 % (ref 0.0–0.2)

## 2018-06-24 LAB — D-DIMER, QUANTITATIVE: D-Dimer, Quant: <0.27 ug/mL-FEU (ref 0.00–0.50)

## 2018-06-24 LAB — GLUCOSE, CAPILLARY
Glucose-Capillary: 241 mg/dL — ABNORMAL HIGH (ref 70–99)
Glucose-Capillary: 316 mg/dL — ABNORMAL HIGH (ref 70–99)
Glucose-Capillary: 286 mg/dL — ABNORMAL HIGH (ref 70–99)

## 2018-06-24 LAB — FERRITIN: Ferritin: 1478 ng/mL — ABNORMAL HIGH (ref 24–336)

## 2018-06-24 LAB — LACTATE DEHYDROGENASE: LDH: 198 U/L — ABNORMAL HIGH (ref 98–192)

## 2018-06-24 LAB — C-REACTIVE PROTEIN: CRP: 6.7 mg/dL — ABNORMAL HIGH (ref <1.0)

## 2018-06-24 MED ORDER — METHYLPREDNISOLONE SODIUM SUCC 40 MG IJ SOLR
40.0000 mg | Freq: Three times a day (TID) | INTRAMUSCULAR | Status: DC
  Administered 2018-06-24 – 2018-06-25 (×3): 40 mg via INTRAVENOUS
  Filled 2018-06-24 (×3): qty 1

## 2018-06-24 NOTE — Progress Notes (Signed)
PROGRESS NOTE  Brian Robertson [id number] DOB: 1970-02-21 DOA: 06/22/2018  [facility]  Brief History/Interval Summary: 48 y.o. male with medical history significant for morbid obesity and recently diagnosed diabetes mellitus (who had not yet started on medication) who was diagnosed over a week ago with coronavirus along with the rest of his family.    He initially was thought to be improving but then his symptoms worsened he came to the emergency department.  He was noted to be mildly hypoxic.  He was hospitalized for further management.   Reason for Visit: Acute respiratory disease due to COVID-19  Consultants: None  Procedures: None  Antibiotics: None  Subjective/Interval History: Interpreter services were utilized to communicate with the patient.  Patient continues to have a cough but this shortness of breath has improved.  He feels stronger.  No nausea vomiting.    Assessment/Plan:  Acute Hypoxic Resp. Failure due to Acute Covid 19 Viral Illness  COVID-19 Labs  Recent Labs    06/22/18 1737 06/23/18 0324 06/24/18 0550  DDIMER  --  0.27 <0.27  FERRITIN 1,415*  --   --   LDH 256*  --  198*  CRP 8.7*  --  6.7*    Lab Results  Component Value Date   SARSCOV2NAA POSITIVE (A) 06/22/2018     Fever: No fever in the last 24 hours Oxygen requirements: Continues to remain on 2 L of oxygen by nasal cannula.  Saturating in the 90s. Antibiotics: None Remdesivir: Not indicated due to prolonged course of illness Steroids: Solu-Medrol 80 mg every 8 hours.  Start tapering. Diuretics: Not indicated yet Actemra: Received 1 dose on 5/30.  Not repeated Convalescent Plasma: Not given yet Vitamin C and Zinc: Continue DVT Prophylaxis:  Lovenox 60 mg once daily  Patient's respiratory status appears to be stable to improved.  CRP has improved to 6.7.  LDH 198.  D-dimer less than 0.27.  We will start tapering his doses of steroids.  Patient  received only 1 dose of Actemra.  Since he improved he did not require additional dose.  Continue mobilization.  Incentive spirometry.  Prone positioning when he is on the bed.  The treatment plan and use of medications and known side effects were discussed with patient. It was clearly explained that there is no proven definitive treatment for COVID-19 infection yet. Any medications used here are based on case reports/anecdotal data which are not peer-reviewed and has not been studied using randomized control trials.  Complete risks and long-term side effects are unknown, however in the best clinical judgment they seem to be of some clinical benefit rather than medical risks.  Patient agreed with the treatment plan and want to receive these treatments as indicated.  He was given steroids and Actemra.  Diabetes mellitus type 2 Recently diagnosed.  Has not started taking metformin yet.  HbA1c is 6.9.  SSI.  Elevated CBG likely due to steroids.  Patient reassured.  Should improve as steroid is tapered down.  Transaminitis Mild elevation in LFTs noted.  Most likely due to acute illness and potentially due to Actemra.  Will need outpatient monitoring.  Morbid obesity BMI 43.07.   DVT Prophylaxis: Lovenox PUD Prophylaxis: Pepcid Code Status: Full code Family Communication: Discussed with the patient Disposition Plan: Management as outlined above.  Will likely return home when improved.   Medications:  Scheduled: . benzonatate  100 mg Oral TID  . enoxaparin (LOVENOX) injection  60 mg Subcutaneous Q24H  .  famotidine  20 mg Oral Daily  . insulin aspart  0-5 Units Subcutaneous QHS  . insulin aspart  0-9 Units Subcutaneous TID WC  . methylPREDNISolone (SOLU-MEDROL) injection  80 mg Intravenous Q8H  . vitamin C  500 mg Oral Daily  . zinc sulfate  220 mg Oral Daily   Continuous:  ZOX:[id number]:acetaminophen, guaiFENesin, ondansetron **OR** ondansetron (ZOFRAN) IV, polyethylene  glycol   Objective:  Vital Signs  Vitals:   06/23/18 1838 06/23/18 2016 06/24/18 0441 06/24/18 0842  BP:  126/81 132/79 115/75  Pulse: 97 96 77 87  Resp: (!) 22 (!) 24 20 18   Temp:  98.4 F (36.9 C) 98 F (36.7 C) 98.3 F (36.8 C)  TempSrc:  Oral Oral Oral  SpO2: 93% 95% 92% 93%  Weight:   117.6 kg   Height:   5\' 7"  (1.702 m)     Intake/Output Summary (Last 24 hours) at 06/24/2018 1038 Last data filed at 06/24/2018 0842 Gross per 24 hour  Intake 180 ml  Output 1425 ml  Net -1245 ml   Filed Weights   06/24/18 0441  Weight: 117.6 kg    General appearance: Awake alert.  In no distress.  Morbidly obese Resp: Normal effort at rest today.  Few crackles at the bases.  No wheezing or rhonchi.   Cardio: S1-S2 is normal regular.  No S3-S4.  No rubs murmurs or bruit GI: Abdomen is soft.  Nontender nondistended.  Bowel sounds are present normal.  No masses organomegaly Extremities: No edema.  Full range of motion of lower extremities. Neurologic: Alert and oriented x3.  No focal neurological deficits.     Lab Results:  Data Reviewed: I have personally reviewed following labs and imaging studies  CBC: Recent Labs  Lab 06/22/18 1241 06/24/18 0550  WBC 5.5 8.3  NEUTROABS  --  6.9  HGB 16.4 15.3  HCT 47.8 46.1  MCV 84.3 86.5  PLT 226 331    Basic Metabolic Panel: Recent Labs  Lab 06/22/18 1241 06/24/18 0550  NA 134* 134*  K 3.7 4.2  CL 101 101  CO2 20* 21*  GLUCOSE 199* 231*  BUN 13 22*  CREATININE 0.84 0.90  CALCIUM 8.5* 8.8*    GFR: Estimated Creatinine Clearance: 124.4 mL/min (by C-G formula based on SCr of 0.9 mg/dL).  Liver Function Tests: Recent Labs  Lab 06/23/18 0324 06/24/18 0550  AST 54* 47*  ALT 95* 140*  ALKPHOS 67 73  BILITOT 0.5 0.4  PROT 7.3 7.1  ALBUMIN 3.3* 3.2*    CBG: Recent Labs  Lab 06/23/18 0807 06/23/18 1141 06/23/18 1655 06/23/18 2029 06/24/18 0841  GLUCAP 259* 298* 287* 274* 241*    Anemia Panel: Recent Labs     06/22/18 1737  FERRITIN 1,415*    Recent Results (from the past 240 hour(s))  Respiratory Panel by PCR     Status: None   Collection Time: 06/22/18  6:30 AM  Result Value Ref Range Status   Adenovirus NOT DETECTED NOT DETECTED Final   Coronavirus 229E NOT DETECTED NOT DETECTED Final    Comment: (NOTE) The Coronavirus on the Respiratory Panel, DOES NOT test for the novel  Coronavirus (2019 nCoV)    Coronavirus HKU1 NOT DETECTED NOT DETECTED Final   Coronavirus NL63 NOT DETECTED NOT DETECTED Final   Coronavirus OC43 NOT DETECTED NOT DETECTED Final   Metapneumovirus NOT DETECTED NOT DETECTED Final   Rhinovirus / Enterovirus NOT DETECTED NOT DETECTED Final   Influenza A NOT DETECTED NOT DETECTED  Final   Influenza B NOT DETECTED NOT DETECTED Final   Parainfluenza Virus 1 NOT DETECTED NOT DETECTED Final   Parainfluenza Virus 2 NOT DETECTED NOT DETECTED Final   Parainfluenza Virus 3 NOT DETECTED NOT DETECTED Final   Parainfluenza Virus 4 NOT DETECTED NOT DETECTED Final   Respiratory Syncytial Virus NOT DETECTED NOT DETECTED Final   Bordetella pertussis NOT DETECTED NOT DETECTED Final   Chlamydophila pneumoniae NOT DETECTED NOT DETECTED Final   Mycoplasma pneumoniae NOT DETECTED NOT DETECTED Final    Comment: Performed at [facility], [address], [place], [place] [address]  SARS Coronavirus 2 (CEPHEID- Performed in [facility] hospital lab), Hosp Order     Status: Abnormal   Collection Time: 06/22/18  1:19 PM  Result Value Ref Range Status   SARS Coronavirus 2 POSITIVE (A) NEGATIVE Final    Comment: RESULT CALLED TO, READ BACK BY AND VERIFIED WITH: JENNIFER WHITLEY AT 1425 ON 06/22/2018 [facility]. (NOTE) If result is NEGATIVE SARS-CoV-2 target nucleic acids are NOT DETECTED. The SARS-CoV-2 RNA is generally detectable in upper and lower  respiratory specimens during the acute phase of infection. The lowest  concentration of SARS-CoV-2 viral copies this assay can detect is 250   copies / mL. A negative result does not preclude SARS-CoV-2 infection  and should not be used as the sole basis for treatment or other  patient management decisions.  A negative result may occur with  improper specimen collection / handling, submission of specimen other  than nasopharyngeal swab, presence of viral mutation(s) within the  areas targeted by this assay, and inadequate number of viral copies  (<250 copies / mL). A negative result must be combined with clinical  observations, patient history, and epidemiological information. If result is POSITIVE SARS-CoV-2 target nucleic acids are DETEC TED. The SARS-CoV-2 RNA is generally detectable in upper and lower  respiratory specimens during the acute phase of infection.  Positive  results are indicative of active infection with SARS-CoV-2.  Clinical  correlation with patient history and other diagnostic information is  necessary to determine patient infection status.  Positive results do  not rule out bacterial infection or co-infection with other viruses. If result is PRESUMPTIVE POSTIVE SARS-CoV-2 nucleic acids MAY BE PRESENT.   A presumptive positive result was obtained on the submitted specimen  and confirmed on repeat testing.  While 2019 novel coronavirus  (SARS-CoV-2) nucleic acids may be present in the submitted sample  additional confirmatory testing may be necessary for epidemiological  and / or clinical management purposes  to differentiate between  SARS-CoV-2 and other Sarbecovirus currently known to infect humans.  If clinically indicated additional testing with an alternate test  methodology (LAB7 453) is advised. The SARS-CoV-2 RNA is generally  detectable in upper and lower respiratory specimens during the acute  phase of infection. The expected result is Negative. Fact Sheet for Patients:  BoilerBrush.com.cy Fact Sheet for Healthcare  Providers: https://pope.com/ This test is not yet approved or cleared by the [place] FDA and has been authorized for detection and/or diagnosis of SARS-CoV-2 by FDA under an Emergency Use Authorization (EUA).  This EUA will remain in effect (meaning this test can be used) for the duration of the COVID-19 declaration under Section 564(b)(1) of the Act, 21 U.S.C. section 360bbb-3(b)(1), unless the authorization is terminated or revoked sooner. Performed at [facility], [address], [place], [place] [address]       Radiology Studies: Dg Chest 1 View  Result  Date: 06/22/2018 CLINICAL DATA:  Difficulty breathing, shortness of breath EXAM: CHEST  1 VIEW COMPARISON:  None. FINDINGS: Low lung volumes with mid and lower lung ill-defined and streaky mixed interstitial/airspace opacities. Minor right base atelectasis. Negative for edema, effusion or pneumothorax. Trachea midline. No significant or acute osseous finding. IMPRESSION: Bilateral mid and lower lung streaky mixed interstitial and airspace opacities compatible with pneumonia/atypical infection. Right base atelectasis. Electronically Signed   By: Judie Petit.  Shick M.D.   On: 06/22/2018 13:41       LOS: 2 days   Jakaiya Netherland Rito Ehrlich  [facility] Pager on [email or web address]  06/24/2018, 10:38 AM

## 2018-06-24 NOTE — Progress Notes (Addendum)
Rito Ehrlich, MD paged regarding CCMD reporting no active tele orders, but the pt is on tele monitoring.   Md advised that tele orders have been d/c.

## 2018-06-24 NOTE — Progress Notes (Signed)
The pt's Wife was given a daily updated via the interpreter.

## 2018-06-24 NOTE — Progress Notes (Signed)
Inpatient Diabetes Program Recommendations  AACE/ADA: New Consensus Statement on Inpatient Glycemic Control (2015)  Target Ranges:  Prepandial:   less than 140 mg/dL      Peak postprandial:   less than 180 mg/dL (1-2 hours)      Critically ill patients:  140 - 180 mg/dL   Lab Results  Component Value Date   GLUCAP 316 (H) 06/24/2018   HGBA1C 6.9 (H) 06/23/2018    Review of Glycemic Control Results for HARVY, MAGNANO (MRN [id number]) as of 06/24/2018 14:58  Ref. Range 06/23/2018 11:41 06/23/2018 16:55 06/23/2018 20:29 06/24/2018 08:41 06/24/2018 12:48  Glucose-Capillary Latest Ref Range: 70 - 99 mg/dL 315 (H) 945 (H) 859 (H) 241 (H) 316 (H)   Diabetes history: DM2 Outpatient Diabetes medications: Metformin 500 mg bid Current orders for Inpatient glycemic control: Novolog sensitive correction tid + hs 0-5  Inpatient Diabetes Program Recommendations:   While in the hospital on steroids: -Lantus 18 units daily (0.15 units/kg x 117.6 kg) -Increase Novolog correction to moderate tid + hs 0-5 units  Thank you, Darel Hong E. Zaine Elsass, RN, MSN, CDE  Diabetes Coordinator Inpatient Glycemic Control Team Team Pager [phone] (8am-5pm) 06/24/2018 3:04 PM

## 2018-06-25 DIAGNOSIS — U071 COVID-19: Principal | ICD-10-CM

## 2018-06-25 DIAGNOSIS — J069 Acute upper respiratory infection, unspecified: Secondary | ICD-10-CM

## 2018-06-25 LAB — CBC WITH DIFFERENTIAL/PLATELET
Abs Immature Granulocytes: 0.15 10*3/uL — ABNORMAL HIGH (ref 0.00–0.07)
Basophils Absolute: 0 10*3/uL (ref 0.0–0.1)
Basophils Relative: 0 %
Eosinophils Absolute: 0 10*3/uL (ref 0.0–0.5)
Eosinophils Relative: 0 %
HCT: 45.6 % (ref 39.0–52.0)
Hemoglobin: 15.2 g/dL (ref 13.0–17.0)
Immature Granulocytes: 1 %
Lymphocytes Relative: 9 %
Lymphs Abs: 1 10*3/uL (ref 0.7–4.0)
MCH: 28.9 pg (ref 26.0–34.0)
MCHC: 33.3 g/dL (ref 30.0–36.0)
MCV: 86.7 fL (ref 80.0–100.0)
Monocytes Absolute: 1.1 10*3/uL — ABNORMAL HIGH (ref 0.1–1.0)
Monocytes Relative: 9 %
Neutro Abs: 9.9 10*3/uL — ABNORMAL HIGH (ref 1.7–7.7)
Neutrophils Relative %: 81 %
Platelets: 384 10*3/uL (ref 150–400)
RBC: 5.26 MIL/uL (ref 4.22–5.81)
RDW: 11.9 % (ref 11.5–15.5)
WBC: 12.1 10*3/uL — ABNORMAL HIGH (ref 4.0–10.5)
nRBC: 0 % (ref 0.0–0.2)

## 2018-06-25 LAB — COMPREHENSIVE METABOLIC PANEL
ALT: 146 U/L — ABNORMAL HIGH (ref 0–44)
AST: 58 U/L — ABNORMAL HIGH (ref 15–41)
Albumin: 3.2 g/dL — ABNORMAL LOW (ref 3.5–5.0)
Alkaline Phosphatase: 69 U/L (ref 38–126)
Anion gap: 13 (ref 5–15)
BUN: 27 mg/dL — ABNORMAL HIGH (ref 6–20)
CO2: 23 mmol/L (ref 22–32)
Calcium: 8.6 mg/dL — ABNORMAL LOW (ref 8.9–10.3)
Chloride: 100 mmol/L (ref 98–111)
Creatinine, Ser: 0.91 mg/dL (ref 0.61–1.24)
GFR calc Af Amer: >60 mL/min (ref >60)
GFR calc non Af Amer: >60 mL/min (ref >60)
Glucose, Bld: 226 mg/dL — ABNORMAL HIGH (ref 70–99)
Potassium: 4.4 mmol/L (ref 3.5–5.1)
Sodium: 136 mmol/L (ref 135–145)
Total Bilirubin: 0.5 mg/dL (ref 0.3–1.2)
Total Protein: 6.8 g/dL (ref 6.5–8.1)

## 2018-06-25 LAB — C-REACTIVE PROTEIN: CRP: 3.1 mg/dL — ABNORMAL HIGH (ref <1.0)

## 2018-06-25 LAB — D-DIMER, QUANTITATIVE: D-Dimer, Quant: <0.27 ug/mL-FEU (ref 0.00–0.50)

## 2018-06-25 LAB — HIV ANTIBODY (ROUTINE TESTING W REFLEX): HIV Screen 4th Generation wRfx: NONREACTIVE

## 2018-06-25 LAB — FERRITIN: Ferritin: 1205 ng/mL — ABNORMAL HIGH (ref 24–336)

## 2018-06-25 LAB — GLUCOSE, CAPILLARY
Glucose-Capillary: 319 mg/dL — ABNORMAL HIGH (ref 70–99)
Glucose-Capillary: 235 mg/dL — ABNORMAL HIGH (ref 70–99)
Glucose-Capillary: 273 mg/dL — ABNORMAL HIGH (ref 70–99)

## 2018-06-25 LAB — INTERLEUKIN-6, PLASMA: Interleukin-6, Plasma: 202 pg/mL — ABNORMAL HIGH (ref 0.0–12.2)

## 2018-06-25 MED ORDER — ZINC SULFATE 220 (50 ZN) MG PO CAPS: 220.0000 mg | ORAL_CAPSULE | Freq: Every day | ORAL | 0 refills | Status: AC

## 2018-06-25 MED ORDER — BENZONATATE 100 MG PO CAPS: 100.0000 mg | ORAL_CAPSULE | Freq: Three times a day (TID) | ORAL | 0 refills | Status: DC

## 2018-06-25 MED ORDER — GUAIFENESIN 100 MG/5ML PO SOLN: 5.0000 mL | ORAL | 0 refills | Status: DC | PRN

## 2018-06-25 MED ORDER — FAMOTIDINE 20 MG PO TABS: 20.0000 mg | ORAL_TABLET | Freq: Every day | ORAL | 0 refills | Status: AC

## 2018-06-25 MED ORDER — PREDNISONE 20 MG PO TABS: ORAL_TABLET | ORAL | 0 refills | Status: DC

## 2018-06-25 MED ORDER — ASCORBIC ACID 500 MG PO TABS: 500.0000 mg | ORAL_TABLET | Freq: Every day | ORAL | 0 refills | Status: AC

## 2018-06-25 NOTE — Discharge Summary (Signed)
[facility]  Physician Discharge Summary   Patient ID: Brian Robertson MRN: [id number] DOB/AGE: 47-Aug-1972 47 y.o.  Admit date: 06/22/2018 Discharge date: 06/25/2018  [facility]  DISCHARGE DIAGNOSES:  Acute respiratory failure with hypoxia secondary to COVID-19 Diabetes mellitus type 2 Mild transaminitis Morbid obesity  RECOMMENDATIONS FOR OUTPATIENT FOLLOW UP: 1. Outpatient follow-up with PCP in 1 to 2 weeks 2. Recheck LFTs in 2 to 3 weeks )   Home Health: None Equipment/Devices: None  CODE STATUS: Full code  DISCHARGE CONDITION: fair  Diet recommendation: As before  INITIAL HISTORY: 48 y.o.malewith medical history significant formorbid obesity and recently diagnosed diabetes mellitus(who had not yet started on medication)who was diagnosed over a week ago with coronavirus along with the rest of his family.   He initially was thought to be improving but then his symptoms worsened he came to the emergency department.  He was noted to be mildly hypoxic.  He was hospitalized for further management.   HOSPITAL COURSE:   Acute Hypoxic Resp. Failure due to Acute Covid 19 Viral Illness Patient presented with symptoms ongoing for a long period of time.  He had other family members who are also sick but they got better.  Patient was hospitalized.  Inflammatory markers were noted to be elevated.  Patient was also hypoxic.  He was given oxygen.  He was started on steroids.  He received 1 dose of Actemra.  Did not require additional doses.  Remdesivir not indicated due to prolonged course of illness.  Patient subsequently started improving.  He was weaned off of oxygen.  Doing much better and okay for discharge today.  Diabetes mellitus type 2 Recently diagnosed.  Has not started taking metformin yet.  HbA1c is 6.9.    CBGs were elevated due to steroids.  This should improve as steroid is tapered down.  Patient instructed to resume his  metformin.  Transaminitis Mild elevation in LFTs noted.  Most likely due to acute illness and potentially due to Actemra.  Will need outpatient monitoring.  Morbid obesity BMI 43.07.  Okay for discharge today.  Interpreter services were utilized to answer all of patient's questions.   PERTINENT LABS:  The results of significant diagnostics from this hospitalization (including imaging, microbiology, ancillary and laboratory) are listed below for reference.    Microbiology: Recent Results (from the past 240 hour(s))  Respiratory Panel by PCR     Status: None   Collection Time: 06/22/18  6:30 AM  Result Value Ref Range Status   Adenovirus NOT DETECTED NOT DETECTED Final   Coronavirus 229E NOT DETECTED NOT DETECTED Final    Comment: (NOTE) The Coronavirus on the Respiratory Panel, DOES NOT test for the novel  Coronavirus (2019 nCoV)    Coronavirus HKU1 NOT DETECTED NOT DETECTED Final   Coronavirus NL63 NOT DETECTED NOT DETECTED Final   Coronavirus OC43 NOT DETECTED NOT DETECTED Final   Metapneumovirus NOT DETECTED NOT DETECTED Final   Rhinovirus / Enterovirus NOT DETECTED NOT DETECTED Final   Influenza A NOT DETECTED NOT DETECTED Final   Influenza B NOT DETECTED NOT DETECTED Final   Parainfluenza Virus 1 NOT DETECTED NOT DETECTED Final   Parainfluenza Virus 2 NOT DETECTED NOT DETECTED Final   Parainfluenza Virus 3 NOT DETECTED NOT DETECTED Final   Parainfluenza Virus 4 NOT DETECTED NOT DETECTED Final   Respiratory Syncytial Virus NOT DETECTED NOT DETECTED Final   Bordetella pertussis NOT DETECTED NOT DETECTED Final   Chlamydophila pneumoniae NOT DETECTED  NOT DETECTED Final   Mycoplasma pneumoniae NOT DETECTED NOT DETECTED Final    Comment: Performed at [facility], [address] [place], [place] [address]  SARS Coronavirus 2 (CEPHEID- Performed in [facility] hospital lab), Hosp Order     Status: Abnormal   Collection Time: 06/22/18  1:19 PM  Result Value Ref Range  Status   SARS Coronavirus 2 POSITIVE (A) NEGATIVE Final    Comment: RESULT CALLED TO, READ BACK BY AND VERIFIED WITH: JENNIFER WHITLEY AT 1425 ON 06/22/2018 [facility]. (NOTE) If result is NEGATIVE SARS-CoV-2 target nucleic acids are NOT DETECTED. The SARS-CoV-2 RNA is generally detectable in upper and lower  respiratory specimens during the acute phase of infection. The lowest  concentration of SARS-CoV-2 viral copies this assay can detect is 250  copies / mL. A negative result does not preclude SARS-CoV-2 infection  and should not be used as the sole basis for treatment or other  patient management decisions.  A negative result may occur with  improper specimen collection / handling, submission of specimen other  than nasopharyngeal swab, presence of viral mutation(s) within the  areas targeted by this assay, and inadequate number of viral copies  (<250 copies / mL). A negative result must be combined with clinical  observations, patient history, and epidemiological information. If result is POSITIVE SARS-CoV-2 target nucleic acids are DETEC TED. The SARS-CoV-2 RNA is generally detectable in upper and lower  respiratory specimens during the acute phase of infection.  Positive  results are indicative of active infection with SARS-CoV-2.  Clinical  correlation with patient history and other diagnostic information is  necessary to determine patient infection status.  Positive results do  not rule out bacterial infection or co-infection with other viruses. If result is PRESUMPTIVE POSTIVE SARS-CoV-2 nucleic acids MAY BE PRESENT.   A presumptive positive result was obtained on the submitted specimen  and confirmed on repeat testing.  While 2019 novel coronavirus  (SARS-CoV-2) nucleic acids may be present in the submitted sample  additional confirmatory testing may be necessary for epidemiological  and / or clinical management purposes  to differentiate between  SARS-CoV-2 and other  Sarbecovirus currently known to infect humans.  If clinically indicated additional testing with an alternate test  methodology (LAB7 453) is advised. The SARS-CoV-2 RNA is generally  detectable in upper and lower respiratory specimens during the acute  phase of infection. The expected result is Negative. Fact Sheet for Patients:  BoilerBrush.com.cy Fact Sheet for Healthcare Providers: https://pope.com/ This test is not yet approved or cleared by the [place] FDA and has been authorized for detection and/or diagnosis of SARS-CoV-2 by FDA under an Emergency Use Authorization (EUA).  This EUA will remain in effect (meaning this test can be used) for the duration of the COVID-19 declaration under Section 564(b)(1) of the Act, 21 U.S.C. section 360bbb-3(b)(1), unless the authorization is terminated or revoked sooner. Performed at [facility], [address] [place], [place] [address]      Labs: Basic Metabolic Panel: Recent Labs  Lab 06/22/18 1241 06/24/18 0550 06/25/18 0349  NA 134* 134* 136  K 3.7 4.2 4.4  CL 101 101 100  CO2 20* 21* 23  GLUCOSE 199* 231* 226*  BUN 13 22* 27*  CREATININE 0.84 0.90 0.91  CALCIUM 8.5* 8.8* 8.6*   Liver Function Tests: Recent Labs  Lab 06/23/18 0324 06/24/18 0550 06/25/18 0349  AST 54* 47* 58*  ALT 95* 140* 146*  ALKPHOS 67 73 69  BILITOT 0.5 0.4 0.5  PROT 7.3 7.1 6.8  ALBUMIN 3.3* 3.2* 3.2*   CBC: Recent Labs  Lab 06/22/18 1241 06/24/18 0550 06/25/18 0349  WBC 5.5 8.3 12.1*  NEUTROABS  --  6.9 9.9*  HGB 16.4 15.3 15.2  HCT 47.8 46.1 45.6  MCV 84.3 86.5 86.7  PLT 226 331 384    CBG: Recent Labs  Lab 06/24/18 0841 06/24/18 1248 06/24/18 1610 06/24/18 2127 06/25/18 0756  GLUCAP 241* 316* 319* 286* 235*     IMAGING STUDIES Dg Chest 1 View  Result Date: 06/22/2018 CLINICAL DATA:  Difficulty breathing, shortness of breath EXAM: CHEST  1 VIEW  COMPARISON:  None. FINDINGS: Low lung volumes with mid and lower lung ill-defined and streaky mixed interstitial/airspace opacities. Minor right base atelectasis. Negative for edema, effusion or pneumothorax. Trachea midline. No significant or acute osseous finding. IMPRESSION: Bilateral mid and lower lung streaky mixed interstitial and airspace opacities compatible with pneumonia/atypical infection. Right base atelectasis. Electronically Signed   By: Judie Petit.  Shick M.D.   On: 06/22/2018 13:41    DISCHARGE EXAMINATION: Vitals:   06/24/18 2040 06/24/18 2118 06/25/18 0357 06/25/18 0801  BP: 118/72 132/90 124/83   Pulse:  90 69   Resp:   20 18  Temp: 98.7 F (37.1 C) 99 F (37.2 C) 98.8 F (37.1 C) 97.7 F (36.5 C)  TempSrc: Oral Oral Oral Oral  SpO2:  93% 93%   Weight:      Height:       General appearance: Awake alert.  In no distress Resp: Clear to auscultation bilaterally.  Normal effort Cardio: S1-S2 is normal regular.  No S3-S4.  No rubs murmurs or bruit GI: Abdomen is soft.  Nontender nondistended.  Bowel sounds are present normal.  No masses organomegaly Extremities: No edema.  Full range of motion of lower extremities. Neurologic: Alert and oriented x3.  No focal neurological deficits.    DISPOSITION: Home  Discharge Instructions    Call MD for:  difficulty breathing, headache or visual disturbances   Complete by:  As directed    Call MD for:  extreme fatigue   Complete by:  As directed    Call MD for:  persistant dizziness or light-headedness   Complete by:  As directed    Call MD for:  persistant nausea and vomiting   Complete by:  As directed    Call MD for:  severe uncontrolled pain   Complete by:  As directed    Diet Carb Modified   Complete by:  As directed    Discharge instructions   Complete by:  As directed    Please start taking metformin for diabetes.    COVID 19 INSTRUCTIONS  - You are felt to be stable enough to no longer require inpatient monitoring,  testing, and treatment, though you will need to follow the recommendations below: - Based on the CDC's non-test criteria for ending self-isolation: You may not return to work/leave the home until at least 14 days since symptom onset AND 3 days without a fever (without taking tylenol, ibuprofen, etc.) AND have improvement in respiratory symptoms. - Do not take NSAID medications (including, but not limited to, ibuprofen, advil, motrin, naproxen, aleve, goody's powder, etc.) - Follow up with your doctor in the next week via telehealth or seek medical attention right away if your symptoms get WORSE.  - Consider donating plasma after you have recovered (either 14 days after a negative test or 28 days after symptoms have completely  resolved) because your antibodies to this virus may be helpful to give to others with life-threatening infections. Please go to the website www.[email or web address] if you would like to consider volunteering for plasma donation.    Directions for you at home:  Wear a facemask You should wear a facemask that covers your nose and mouth when you are in the same room with other people and when you visit a healthcare provider. People who live with or visit you should also wear a facemask while they are in the same room with you.  Separate yourself from other people in your home As much as possible, you should stay in a different room from other people in your home. Also, you should use a separate bathroom, if available.  Avoid sharing household items You should not share dishes, drinking glasses, cups, eating utensils, towels, bedding, or other items with other people in your home. After using these items, you should wash them thoroughly with soap and water.  Cover your coughs and sneezes Cover your mouth and nose with a tissue when you cough or sneeze, or you can cough or sneeze into your sleeve. Throw used tissues in a lined trash can, and immediately wash your hands with soap and  water for at least 20 seconds or use an alcohol-based hand rub.  Wash your [employer] your hands often and thoroughly with soap and water for at least 20 seconds. You can use an alcohol-based hand sanitizer if soap and water are not available and if your hands are not visibly dirty. Avoid touching your eyes, nose, and mouth with unwashed hands.  Directions for those who live with, or provide care at home for you:  Limit the number of people who have contact with the patient If possible, have only one caregiver for the patient. Other household members should stay in another home or place of residence. If this is not possible, they should stay in another room, or be separated from the patient as much as possible. Use a separate bathroom, if available. Restrict visitors who do not have an essential need to be in the home.  Ensure good ventilation Make sure that shared spaces in the home have good air flow, such as from an air conditioner or an opened window, weather permitting.  Wash your hands often Wash your hands often and thoroughly with soap and water for at least 20 seconds. You can use an alcohol based hand sanitizer if soap and water are not available and if your hands are not visibly dirty. Avoid touching your eyes, nose, and mouth with unwashed hands. Use disposable paper towels to dry your hands. If not available, use dedicated cloth towels and replace them when they become wet.  Wear a facemask and gloves Wear a disposable facemask at all times in the room and gloves when you touch or have contact with the patients blood, body fluids, and/or secretions or excretions, such as sweat, saliva, sputum, nasal mucus, vomit, urine, or feces.  Ensure the mask fits over your nose and mouth tightly, and do not touch it during use. Throw out disposable facemasks and gloves after using them. Do not reuse. Wash your hands immediately after removing your facemask and gloves. If your personal  clothing becomes contaminated, carefully remove clothing and launder. Wash your hands after handling contaminated clothing. Place all used disposable facemasks, gloves, and other waste in a lined container before disposing them with other household waste. Remove gloves and wash your hands immediately after handling  these items.  Do not share dishes, glasses, or other household items with the patient Avoid sharing household items. You should not share dishes, drinking glasses, cups, eating utensils, towels, bedding, or other items with a patient who is confirmed to have, or being evaluated for, COVID-19 infection. After the person uses these items, you should wash them thoroughly with soap and water.  Wash laundry thoroughly Immediately remove and wash clothes or bedding that have blood, body fluids, and/or secretions or excretions, such as sweat, saliva, sputum, nasal mucus, vomit, urine, or feces, on them. Wear gloves when handling laundry from the patient. Read and follow directions on labels of laundry or clothing items and detergent. In general, wash and dry with the warmest temperatures recommended on the label.  Clean all areas the individual has used often Clean all touchable surfaces, such as counters, tabletops, doorknobs, bathroom fixtures, toilets, phones, keyboards, tablets, and bedside tables, every day. Also, clean any surfaces that may have blood, body fluids, and/or secretions or excretions on them. Wear gloves when cleaning surfaces the patient has come in contact with. Use a diluted bleach solution (e.g., dilute bleach with 1 part bleach and 10 parts water) or a household disinfectant with a label that says EPA-registered for coronaviruses. To make a bleach solution at home, add 1 tablespoon of bleach to 1 quart (4 cups) of water. For a larger supply, add  cup of bleach to 1 gallon (16 cups) of water. Read labels of cleaning products and follow recommendations provided on product  labels. Labels contain instructions for safe and effective use of the cleaning product including precautions you should take when applying the product, such as wearing gloves or eye protection and making sure you have good ventilation during use of the product. Remove gloves and wash hands immediately after cleaning.  Monitor yourself for signs and symptoms of illness Caregivers and household members are considered close contacts, should monitor their health, and will be asked to limit movement outside of the home to the extent possible. Follow the monitoring steps for close contacts listed on the symptom monitoring form.   If you have additional questions, contact your local health department or call the epidemiologist on call at [phone] (available 24/7). This guidance is subject to change. For the most up-to-date guidance from [facility], please refer to their website: TripMetro.hu   You were cared for by a hospitalist during your hospital stay. If you have any questions about your discharge medications or the care you received while you were in the hospital after you are discharged, you can call the unit and asked to speak with the hospitalist on call if the hospitalist that took care of you is not available. Once you are discharged, your primary care physician will handle any further medical issues. Please note that NO REFILLS for any discharge medications will be authorized once you are discharged, as it is imperative that you return to your primary care physician (or establish a relationship with a primary care physician if you do not have one) for your aftercare needs so that they can reassess your need for medications and monitor your lab values. If you do not have a primary care physician, you can call [phone] for a physician referral.   Increase activity slowly   Complete by:  As directed         Allergies as of 06/25/2018     No Known Allergies     Medication List    TAKE these medications  ascorbic acid 500 MG tablet Commonly known as:  VITAMIN C Take 1 tablet (500 mg total) by mouth daily for 14 days.   benzonatate 100 MG capsule Commonly known as:  TESSALON Take 1 capsule (100 mg total) by mouth 3 (three) times daily.   famotidine 20 MG tablet Commonly known as:  PEPCID Take 1 tablet (20 mg total) by mouth daily for 7 days.   guaiFENesin 100 MG/5ML Soln Commonly known as:  ROBITUSSIN Take 5 mLs (100 mg total) by mouth every 4 (four) hours as needed for cough or to loosen phlegm.   metFORMIN 500 MG tablet Commonly known as:  GLUCOPHAGE Take 500 mg by mouth 2 (two) times daily with a meal.   predniSONE 20 MG tablet Commonly known as:  DELTASONE Take 2 tablets orally twice daily for 3 days followed by 2 tablets orally once daily for 3 days followed by 1 tablet orally once daily for 3 days and then stop   zinc sulfate 220 (50 Zn) MG capsule Take 1 capsule (220 mg total) by mouth daily for 14 days.        Follow-up Information    [facility] Follow up on 07/02/2018.   Why:  Hospital follow-up appointment telephone visit at 2:30 office will call at time of appointment. Contact information: [address] [place] [address] [phone]          TOTAL DISCHARGE TIME: 35 minutes  Dempsey Ahonen Rito Ehrlich  [facility] Pager on [email or web address]  06/25/2018, 10:06 AM

## 2018-06-25 NOTE — Discharge Instructions (Signed)
Person Under Monitoring Name: Brian Robertson  Location: [address] [place] [address]   Infection Prevention Recommendations for Individuals Confirmed to have, or Being Evaluated for, 2019 Novel Coronavirus (COVID-19) Infection Who Receive Care at Home  Individuals who are confirmed to have, or are being evaluated for, COVID-19 should follow the prevention steps below until a healthcare provider or local or state health department says they can return to normal activities.  Stay home except to get medical care You should restrict activities outside your home, except for getting medical care. Do not go to work, school, or public areas, and do not use public transportation or taxis.  Call ahead before visiting your doctor Before your medical appointment, call the healthcare provider and tell them that you have, or are being evaluated for, COVID-19 infection. This will help the healthcare providers office take steps to keep other people from getting infected. Ask your healthcare provider to call the local or state health department.  Monitor your symptoms Seek prompt medical attention if your illness is worsening (e.g., difficulty breathing). Before going to your medical appointment, call the healthcare provider and tell them that you have, or are being evaluated for, COVID-19 infection. Ask your healthcare provider to call the local or state health department.  Wear a facemask You should wear a facemask that covers your nose and mouth when you are in the same room with other people and when you visit a healthcare provider. People who live with or visit you should also wear a facemask while they are in the same room with you.  Separate yourself from other people in your home As much as possible, you should stay in a different room from other people in your home. Also, you should use a separate bathroom, if available.  Avoid sharing household items You should not  share dishes, drinking glasses, cups, eating utensils, towels, bedding, or other items with other people in your home. After using these items, you should wash them thoroughly with soap and water.  Cover your coughs and sneezes Cover your mouth and nose with a tissue when you cough or sneeze, or you can cough or sneeze into your sleeve. Throw used tissues in a lined trash can, and immediately wash your hands with soap and water for at least 20 seconds or use an alcohol-based hand rub.  Wash your [employer] your hands often and thoroughly with soap and water for at least 20 seconds. You can use an alcohol-based hand sanitizer if soap and water are not available and if your hands are not visibly dirty. Avoid touching your eyes, nose, and mouth with unwashed hands.   Prevention Steps for Caregivers and Household Members of Individuals Confirmed to have, or Being Evaluated for, COVID-19 Infection Being Cared for in the Home  If you live with, or provide care at home for, a person confirmed to have, or being evaluated for, COVID-19 infection please follow these guidelines to prevent infection:  Follow healthcare providers instructions Make sure that you understand and can help the patient follow any healthcare provider instructions for all care.  Provide for the patients basic needs You should help the patient with basic needs in the home and provide support for getting groceries, prescriptions, and other personal needs.  Monitor the patients symptoms If they are getting sicker, call his or her medical provider and tell them that the patient has, or is being evaluated for, COVID-19 infection. This will help the healthcare providers  office take steps to keep other people from getting infected. Ask the healthcare provider to call the local or state health department.  Limit the number of people who have contact with the patient  If possible, have only one caregiver for the  patient.  Other household members should stay in another home or place of residence. If this is not possible, they should stay  in another room, or be separated from the patient as much as possible. Use a separate bathroom, if available.  Restrict visitors who do not have an essential need to be in the home.  Keep older adults, very young children, and other sick people away from the patient Keep older adults, very young children, and those who have compromised immune systems or chronic health conditions away from the patient. This includes people with chronic heart, lung, or kidney conditions, diabetes, and cancer.  Ensure good ventilation Make sure that shared spaces in the home have good air flow, such as from an air conditioner or an opened window, weather permitting.  Wash your hands often  Wash your hands often and thoroughly with soap and water for at least 20 seconds. You can use an alcohol based hand sanitizer if soap and water are not available and if your hands are not visibly dirty.  Avoid touching your eyes, nose, and mouth with unwashed hands.  Use disposable paper towels to dry your hands. If not available, use dedicated cloth towels and replace them when they become wet.  Wear a facemask and gloves  Wear a disposable facemask at all times in the room and gloves when you touch or have contact with the patients blood, body fluids, and/or secretions or excretions, such as sweat, saliva, sputum, nasal mucus, vomit, urine, or feces.  Ensure the mask fits over your nose and mouth tightly, and do not touch it during use.  Throw out disposable facemasks and gloves after using them. Do not reuse.  Wash your hands immediately after removing your facemask and gloves.  If your personal clothing becomes contaminated, carefully remove clothing and launder. Wash your hands after handling contaminated clothing.  Place all used disposable facemasks, gloves, and other waste in a lined  container before disposing them with other household waste.  Remove gloves and wash your hands immediately after handling these items.  Do not share dishes, glasses, or other household items with the patient  Avoid sharing household items. You should not share dishes, drinking glasses, cups, eating utensils, towels, bedding, or other items with a patient who is confirmed to have, or being evaluated for, COVID-19 infection.  After the person uses these items, you should wash them thoroughly with soap and water.  Wash laundry thoroughly  Immediately remove and wash clothes or bedding that have blood, body fluids, and/or secretions or excretions, such as sweat, saliva, sputum, nasal mucus, vomit, urine, or feces, on them.  Wear gloves when handling laundry from the patient.  Read and follow directions on labels of laundry or clothing items and detergent. In general, wash and dry with the warmest temperatures recommended on the label.  Clean all areas the individual has used often  Clean all touchable surfaces, such as counters, tabletops, doorknobs, bathroom fixtures, toilets, phones, keyboards, tablets, and bedside tables, every day. Also, clean any surfaces that may have blood, body fluids, and/or secretions or excretions on them.  Wear gloves when cleaning surfaces the patient has come in contact with.  Use a diluted bleach solution (e.g., dilute bleach with 1  part bleach and 10 parts water) or a household disinfectant with a label that says EPA-registered for coronaviruses. To make a bleach solution at home, add 1 tablespoon of bleach to 1 quart (4 cups) of water. For a larger supply, add  cup of bleach to 1 gallon (16 cups) of water.  Read labels of cleaning products and follow recommendations provided on product labels. Labels contain instructions for safe and effective use of the cleaning product including precautions you should take when applying the product, such as wearing gloves or  eye protection and making sure you have good ventilation during use of the product.  Remove gloves and wash hands immediately after cleaning.  Monitor yourself for signs and symptoms of illness Caregivers and household members are considered close contacts, should monitor their health, and will be asked to limit movement outside of the home to the extent possible. Follow the monitoring steps for close contacts listed on the symptom monitoring form.   ? If you have additional questions, contact your local health department or call the epidemiologist on call at [phone] (available 24/7). ? This guidance is subject to change. For the most up-to-date guidance from [facility], please refer to their website: [email or web address]

## 2018-07-02 ENCOUNTER — Other Ambulatory Visit: Payer: Self-pay

## 2018-07-02 ENCOUNTER — Ambulatory Visit: Payer: Self-pay | Attending: Family Medicine | Admitting: Family Medicine

## 2018-11-19 ENCOUNTER — Encounter: Payer: Self-pay | Admitting: *Deleted

## 2018-11-27 ENCOUNTER — Emergency Department
Admission: EM | Admit: 2018-11-27 | Discharge: 2018-11-27 | Disposition: A | Discharge status: Home / Self Care | Payer: BC Managed Care – PPO | Attending: Emergency Medicine | Admitting: Emergency Medicine

## 2018-11-27 ENCOUNTER — Emergency Department: Payer: BC Managed Care – PPO

## 2018-11-27 ENCOUNTER — Other Ambulatory Visit: Payer: Self-pay

## 2018-11-27 DIAGNOSIS — E119 Type 2 diabetes mellitus without complications: Secondary | ICD-10-CM | POA: Diagnosis not present

## 2018-11-27 DIAGNOSIS — Z79899 Other long term (current) drug therapy: Secondary | ICD-10-CM | POA: Insufficient documentation

## 2018-11-27 DIAGNOSIS — Z7984 Long term (current) use of oral hypoglycemic drugs: Secondary | ICD-10-CM | POA: Insufficient documentation

## 2018-11-27 DIAGNOSIS — R1013 Epigastric pain: Secondary | ICD-10-CM | POA: Diagnosis present

## 2018-11-27 DIAGNOSIS — R070 Pain in throat: Secondary | ICD-10-CM | POA: Diagnosis not present

## 2018-11-27 LAB — CBC WITH DIFFERENTIAL/PLATELET
Abs Immature Granulocytes: 0.01 10*3/uL (ref 0.00–0.07)
Basophils Absolute: 0 10*3/uL (ref 0.0–0.1)
Basophils Relative: 1 %
Eosinophils Absolute: 0.1 10*3/uL (ref 0.0–0.5)
Eosinophils Relative: 2 %
HCT: 45.2 % (ref 39.0–52.0)
Hemoglobin: 15.2 g/dL (ref 13.0–17.0)
Immature Granulocytes: 0 %
Lymphocytes Relative: 25 %
Lymphs Abs: 1.2 10*3/uL (ref 0.7–4.0)
MCH: 28.7 pg (ref 26.0–34.0)
MCHC: 33.6 g/dL (ref 30.0–36.0)
MCV: 85.3 fL (ref 80.0–100.0)
Monocytes Absolute: 0.5 10*3/uL (ref 0.1–1.0)
Monocytes Relative: 11 %
Neutro Abs: 2.8 10*3/uL (ref 1.7–7.7)
Neutrophils Relative %: 61 %
Platelets: 173 10*3/uL (ref 150–400)
RBC: 5.3 MIL/uL (ref 4.22–5.81)
RDW: 12 % (ref 11.5–15.5)
WBC: 4.7 10*3/uL (ref 4.0–10.5)
nRBC: 0 % (ref 0.0–0.2)

## 2018-11-27 LAB — COMPREHENSIVE METABOLIC PANEL
ALT: 73 U/L — ABNORMAL HIGH (ref 0–44)
AST: 40 U/L (ref 15–41)
Albumin: 4.6 g/dL (ref 3.5–5.0)
Alkaline Phosphatase: 51 U/L (ref 38–126)
Anion gap: 11 (ref 5–15)
BUN: 12 mg/dL (ref 6–20)
CO2: 24 mmol/L (ref 22–32)
Calcium: 9.2 mg/dL (ref 8.9–10.3)
Chloride: 103 mmol/L (ref 98–111)
Creatinine, Ser: 1.08 mg/dL (ref 0.61–1.24)
GFR calc Af Amer: >60 mL/min (ref >60)
GFR calc non Af Amer: >60 mL/min (ref >60)
Glucose, Bld: 112 mg/dL — ABNORMAL HIGH (ref 70–99)
Potassium: 3.7 mmol/L (ref 3.5–5.1)
Sodium: 138 mmol/L (ref 135–145)
Total Bilirubin: 1.3 mg/dL — ABNORMAL HIGH (ref 0.3–1.2)
Total Protein: 7.4 g/dL (ref 6.5–8.1)

## 2018-11-27 LAB — LIPASE, BLOOD: Lipase: 20 U/L (ref 11–51)

## 2018-11-27 LAB — TROPONIN I (HIGH SENSITIVITY): Troponin I (High Sensitivity): 4 ng/L (ref <18)

## 2018-11-27 MED ORDER — ALUM & MAG HYDROXIDE-SIMETH 200-200-20 MG/5ML PO SUSP
30.0000 mL | Freq: Once | ORAL | Status: AC
  Administered 2018-11-27: 30 mL via ORAL
  Filled 2018-11-27: qty 30

## 2018-11-27 MED ORDER — LIDOCAINE VISCOUS HCL 2 % MT SOLN
15.0000 mL | Freq: Once | OROMUCOSAL | Status: AC
  Administered 2018-11-27: 15 mL via ORAL
  Filled 2018-11-27: qty 15

## 2018-11-27 NOTE — ED Provider Notes (Signed)
[facility] Emergency Department Provider Note  ____________________________________________   First MD Initiated Contact with Patient 11/27/18 [phone]     (approximate)  I have reviewed the triage vital signs and the nursing notes.   HISTORY  Chief Complaint Abdominal Pain (Epigastric)    HPI Brian Robertson is a 48 y.o. male with diabetes who presents with epigastric burning pain that radiates down to his throat.  He is taking omeprazole without any relief of symptoms.  Patient says it feels like there is something stuck in his throat.  This been going on for 2 months, constant, nothing makes it better, nothing makes it worse.  He still tolerating eating but says that he is afraid to eat pieces afraid that his throat will close up all of a sudden.  He does have a GI appointment in December.  He denies any shortness of breath.  However sometimes at nighttime his wife reports that he snores a lot and then he will wake up startled.  He is never been tested for sleep apnea in the past.        Past Medical History:  Diagnosis Date   Diabetes mellitus without complication [facility])     Patient Active Problem List   Diagnosis Date Noted   Diabetes mellitus without complication (HCC) 06/22/2018   Acute respiratory disease due to COVID-19 virus 06/22/2018   Acute respiratory failure with hypoxia (HCC) 06/22/2018   Obesity, Class III, BMI 40-49.9 (morbid obesity) (HCC) 06/22/2018    Past Surgical History:  Procedure Laterality Date   FOREARM FRACTURE SURGERY Left     Prior to Admission medications   Medication Sig Start Date End Date Taking? Authorizing Provider  benzonatate (TESSALON) 100 MG capsule Take 1 capsule (100 mg total) by mouth 3 (three) times daily. 06/25/18   Osvaldo Shipper, MD  famotidine (PEPCID) 20 MG tablet Take 1 tablet (20 mg total) by mouth daily for 7 days. 06/25/18 07/02/18  Osvaldo Shipper, MD  guaiFENesin (ROBITUSSIN) 100  MG/5ML SOLN Take 5 mLs (100 mg total) by mouth every 4 (four) hours as needed for cough or to loosen phlegm. 06/25/18   Osvaldo Shipper, MD  metFORMIN (GLUCOPHAGE) 500 MG tablet Take 500 mg by mouth 2 (two) times daily with a meal.    [provider]  predniSONE (DELTASONE) 20 MG tablet Take 2 tablets orally twice daily for 3 days followed by 2 tablets orally once daily for 3 days followed by 1 tablet orally once daily for 3 days and then stop 06/25/18   Osvaldo Shipper, MD    Allergies Patient has no known allergies.  No family history on file.  Social History Social History   Tobacco Use   Smoking status: Never Smoker   Smokeless tobacco: Never Used  Substance Use Topics   Alcohol use: No   Drug use: Never      Review of Systems Constitutional: No fever/chills Eyes: No visual changes. ENT: Sensation in his throat Cardiovascular: Denies chest pain. Respiratory: Denies shortness of breath. Gastrointestinal: Epigastric abdominal pain.  No nausea, no vomiting.  No diarrhea.  No constipation. Genitourinary: Negative for dysuria. Musculoskeletal: Negative for back pain. Skin: Negative for rash. Neurological: Negative for headaches, focal weakness or numbness. All other ROS negative ____________________________________________   PHYSICAL EXAM:  VITAL SIGNS: ED Triage Vitals  Enc Vitals Group     BP 11/27/18 0818 128/85     Pulse Rate 11/27/18 0818 71     Resp 11/27/18 0818  18     Temp 11/27/18 0818 98 F (36.7 C)     Temp Source 11/27/18 0818 Oral     SpO2 11/27/18 0818 98 %     Weight 11/27/18 0819 253 lb (114.8 kg)     Height 11/27/18 0819 5\' 7"  (1.702 m)     Head Circumference --      Peak Flow --      Pain Score 11/27/18 0819 0     Pain Loc --      Pain Edu? --      Excl. in GC? --     Constitutional: Alert and oriented. Well appearing and in no acute distress. Eyes: Conjunctivae are normal. EOMI. Head: Atraumatic. Nose: No  congestion/rhinnorhea. Mouth/Throat: Mucous membranes are moist.  OP is clear Neck: No stridor. Trachea Midline. FROM Cardiovascular: Normal rate, regular rhythm. Grossly normal heart sounds.  Good peripheral circulation. Respiratory: Normal respiratory effort.  No retractions. Lungs CTAB. Gastrointestinal: Soft and nontender. No distention. No abdominal bruits.  Musculoskeletal: No lower extremity tenderness nor edema.  No joint effusions. Neurologic:  Normal speech and language. No gross focal neurologic deficits are appreciated.  Skin:  Skin is warm, dry and intact. No rash noted. Psychiatric: Mood and affect are normal. Speech and behavior are normal. GU: Deferred   ____________________________________________   LABS (all labs ordered are listed, but only abnormal results are displayed)  Labs Reviewed  COMPREHENSIVE METABOLIC PANEL - Abnormal; Notable for the following components:      Result Value   Glucose, Bld 112 (*)    ALT 73 (*)    Total Bilirubin 1.3 (*)    All other components within normal limits  CBC WITH DIFFERENTIAL/PLATELET  LIPASE, BLOOD  URINALYSIS, ROUTINE W REFLEX MICROSCOPIC  TROPONIN I (HIGH SENSITIVITY)  TROPONIN I (HIGH SENSITIVITY)   ____________________________________________   ED ECG REPORT I, Concha SeMary E Azhar Knope, the attending physician, personally viewed and interpreted this ECG.  EKG normal sinus rate of 60, no ST elevation, T wave inversion in lead III, right axis deviation.  Normal intervals.  Looks similar to prior EKGs. ____________________________________________  RADIOLOGY Vela ProseI, Ellyssa Zagal E Janathan Bribiesca, personally viewed and evaluated these images (plain radiographs) as part of my medical decision making, as well as reviewing the written report by the radiologist.  ED MD interpretation: No mass noted  Official radiology report(s): Dg Neck Soft Tissue  Result Date: 11/27/2018 CLINICAL DATA:  Throat pain. EXAM: NECK SOFT TISSUES - 1+ VIEW COMPARISON:   None. FINDINGS: Normal appearance of the epiglottis and aryepiglottic folds. The vallecular airspace is maintained. No hypopharyngeal distension or subglottic narrowing. No abnormal prevertebral or retropharyngeal soft tissue swelling or abnormal air collections. The cervical vertebral bodies are unremarkable. The lung apices are grossly clear. IMPRESSION: Unremarkable soft tissue examination of the neck. Electronically Signed   By: Rudie MeyerP.  Gallerani M.D.   On: 11/27/2018 09:21    ____________________________________________   PROCEDURES  Procedure(s) performed (including Critical Care):  Procedures   ____________________________________________   INITIAL IMPRESSION / ASSESSMENT AND PLAN / ED COURSE  Girtha HakeJose Mario Ginger OrganCruz Rodriguez was evaluated in Emergency Department on 11/27/2018 for the symptoms described in the history of present illness. He was evaluated in the context of the global COVID-19 pandemic, which necessitated consideration that the patient might be at risk for infection with the SARS-CoV-2 virus that causes COVID-19. Institutional protocols and algorithms that pertain to the evaluation of patients at risk for COVID-19 are in a state of rapid change based on information  released by regulatory bodies including the CDC and federal and state organizations. These policies and algorithms were followed during the patient's care in the ED.     Patient is a well-appearing 48 year old who presents with some epigastric burning sensation as well as abnormal sensation in his throat.  Is been going on for 2 months now.  Will get labs to evaluate for electrolyte abnormalities, AKI.  Patient currently has no abdominal tenderness to suggest cholecystitis and I have low suspicion given the description of his symptoms sounds more likely related to acid reflux.  Will get x-ray of his neck to ensure there is no evidence of mass that could be compressing his trachea no obvious mass felt on exam.  Oropharynx  with no evidence of PTA.  Full range of motion of neck suspicion for RPA.  Discussed with patient that if work-up is negative that he needs to follow-up with GI for endoscopy as well as follow-up with his primary care doctor for evaluation for sleep apnea.  Patient's labs are reassuring.  Slightly elevated LFT but patient has no right upper quadrant abdominal pain to suggest gallbladder issues.  Cardiac markers ruled out for ACS.  X-ray of his neck was unremarkable.  Patient is able to tolerate p.o.  I explained to patient he should follow-up with GI for endoscopy as well as his primary care doctor for evaluation for sleep apnea.  Patient felt comfortable with this plan.  I discussed the provisional nature of ED diagnosis, the treatment so far, the ongoing plan of care, follow up appointments and return precautions with the patient and any family or support people present. They expressed understanding and agreed with the plan, discharged home.   ____________________________________________   FINAL CLINICAL IMPRESSION(S) / ED DIAGNOSES   Final diagnoses:  Throat discomfort      MEDICATIONS GIVEN DURING THIS VISIT:  Medications  alum & mag hydroxide-simeth (MAALOX/MYLANTA) 200-200-20 MG/5ML suspension 30 mL (30 mLs Oral Given 11/27/18 0916)    And  lidocaine (XYLOCAINE) 2 % viscous mouth solution 15 mL (15 mLs Oral Given 11/27/18 0347)     ED Discharge Orders    None       Note:  This document was prepared using Dragon voice recognition software and may include unintentional dictation errors.   Vanessa Hickman, MD 11/27/18 1026

## 2018-11-27 NOTE — ED Notes (Signed)
Pt given water for PO challenge. No issue with drinking water.

## 2018-11-27 NOTE — ED Notes (Signed)
This RN at bedside with Ronnald Collum (interpreter), and Dr Jari Pigg. Pt states for the past two months he has been suffering with acid reflux, he has a GI appointment in December but he states he cannot wait until December because of the pain he is experiencing in his throat. Pt states he is afraid to eat or drink because of the pain. Sometimes in the morning when he tries to take a deep breath he feels like he can't breath. Pt reports sensation is only in his throat and it feels like something is blocking his throat. Pt denies abdominal pain.

## 2018-11-27 NOTE — ED Triage Notes (Signed)
Pt c/o epigastric burning pain that radiates into the throat, worse at night when he lays down. States he has been taking omeprazole with minimal relief. Denies N/V.. c/op having a sore throat with it.

## 2018-11-27 NOTE — Discharge Instructions (Addendum)
You should follow-up with GI for the endoscopy as well as follow-up with your primary care doctor for evaluation for sleep apnea.  If you are unable to tolerate eating you should return to the ER or any other concerns

## 2018-12-03 ENCOUNTER — Encounter: Payer: Self-pay | Admitting: Gastroenterology

## 2018-12-03 ENCOUNTER — Ambulatory Visit: Payer: BC Managed Care – PPO | Admitting: Gastroenterology

## 2018-12-03 ENCOUNTER — Other Ambulatory Visit: Payer: Self-pay

## 2018-12-03 VITALS — BP 113/79 | HR 70 | Temp 98.1°F | Ht 67.0 in | Wt 249.0 lb

## 2018-12-03 DIAGNOSIS — K2100 Gastro-esophageal reflux disease with esophagitis, without bleeding: Secondary | ICD-10-CM | POA: Diagnosis not present

## 2018-12-03 DIAGNOSIS — R1312 Dysphagia, oropharyngeal phase: Secondary | ICD-10-CM

## 2018-12-03 MED ORDER — PANTOPRAZOLE SODIUM 40 MG PO TBEC: 40.0000 mg | DELAYED_RELEASE_TABLET | Freq: Every day | ORAL | 6 refills | Status: DC

## 2018-12-03 NOTE — Progress Notes (Signed)
Gastroenterology Consultation  Referring Provider:     Sandrea Hughs, NP Primary Care Physician:  Sandrea Hughs, NP Primary Gastroenterologist:  Dr. Servando Snare     Reason for Consultation:     Dysphagia        HPI:   Savalas Monje Darrly Loberg is a 48 y.o. y/o male referred for consultation & management of dysphagia by Dr. Sandrea Hughs, NP.  This patient comes in today with a report of dysphagia.  The patient states his symptoms started on October 8.  The patient states that since then he has not been able to eat like he did before and has lost 45 pounds.  The patient denies any change in bowel habits such as constipation or diarrhea.  He also denies any abdominal pain associated with his symptoms.  He reports that when he lays down at night he gets choked and at certain times he has had to have the Heimlich maneuver done because food gets stuck on the way down.  The patient denies any hematemesis.  He states that both liquids and solids are an issue.  He also reports that his throat is always dry.  There is no overt heartburn that he can attest to.  The patient denies any family history of colon cancer or colon polyps. The patient's entire history is gotten through a interpreter.  The patient also is here with his wife who also does not speak [place].  Besides having the regurgitation and dry throat he also reports that he has modified his diet to eat very few things since he is worried that they may choke him.  The patient was started on omeprazole 20 mg and does not report any difference with that.  Past Medical History:  Diagnosis Date  . Diabetes mellitus without complication [facility])     Past Surgical History:  Procedure Laterality Date  . FOREARM FRACTURE SURGERY Left     Prior to Admission medications   Medication Sig Start Date End Date Taking? Authorizing Provider  omeprazole (PRILOSEC) 20 MG capsule Take 20 mg by mouth daily.   Yes [provider]  benzonatate (TESSALON)  100 MG capsule Take 1 capsule (100 mg total) by mouth 3 (three) times daily. Patient not taking: Reported on 12/03/2018 06/25/18   Osvaldo Shipper, MD  famotidine (PEPCID) 20 MG tablet Take 1 tablet (20 mg total) by mouth daily for 7 days. 06/25/18 07/02/18  Osvaldo Shipper, MD  guaiFENesin (ROBITUSSIN) 100 MG/5ML SOLN Take 5 mLs (100 mg total) by mouth every 4 (four) hours as needed for cough or to loosen phlegm. Patient not taking: Reported on 12/03/2018 06/25/18   Osvaldo Shipper, MD  metFORMIN (GLUCOPHAGE) 500 MG tablet Take 500 mg by mouth 2 (two) times daily with a meal.    [provider]  pantoprazole (PROTONIX) 40 MG tablet Take 1 tablet (40 mg total) by mouth at bedtime. 12/03/18   Midge Minium, MD  predniSONE (DELTASONE) 20 MG tablet Take 2 tablets orally twice daily for 3 days followed by 2 tablets orally once daily for 3 days followed by 1 tablet orally once daily for 3 days and then stop Patient not taking: Reported on 12/03/2018 06/25/18   Osvaldo Shipper, MD    History reviewed. No pertinent family history.   Social History   Tobacco Use  . Smoking status: Never Smoker  . Smokeless tobacco: Never Used  Substance Use Topics  . Alcohol use: No  . Drug use: Never    Allergies  as of 12/03/2018  . (No Known Allergies)    Review of Systems:    All systems reviewed and negative except where noted in HPI.   Physical Exam:  BP 113/79 (BP Location: Left Arm, Patient Position: Sitting)   Pulse 70   Temp 98.1 F (36.7 C) (Oral)   Ht 5\' 7"  (1.702 m)   Wt 249 lb (112.9 kg)   BMI 39.00 kg/m  No LMP for male patient. General:   Alert,  Well-developed, well-nourished, pleasant and cooperative in NAD Head:  Normocephalic and atraumatic. Eyes:  Sclera clear, no icterus.   Conjunctiva pink. Ears:  Normal auditory acuity. Neck:  Supple; no masses or thyromegaly. Lungs:  Respirations even and unlabored.  Clear throughout to auscultation.   No wheezes, crackles, or rhonchi. No  acute distress. Heart:  Regular rate and rhythm; no murmurs, clicks, rubs, or gallops. Abdomen:  Normal bowel sounds.  No bruits.  Soft, non-tender and non-distended without masses, hepatosplenomegaly or hernias noted.  No guarding or rebound tenderness.  Negative Carnett sign.   Rectal:  Deferred.  Msk:  Symmetrical without gross deformities.  Good, equal movement & strength bilaterally. Pulses:  Normal pulses noted. Extremities:  No clubbing or edema.  No cyanosis. Neurologic:  Alert and oriented x3;  grossly normal neurologically. Skin:  Intact without significant lesions or rashes.  No jaundice. Lymph Nodes:  No significant cervical adenopathy. Psych:  Alert and cooperative. Normal mood and affect.  Imaging Studies: Dg Neck Soft Tissue  Result Date: 11/27/2018 CLINICAL DATA:  Throat pain. EXAM: NECK SOFT TISSUES - 1+ VIEW COMPARISON:  None. FINDINGS: Normal appearance of the epiglottis and aryepiglottic folds. The vallecular airspace is maintained. No hypopharyngeal distension or subglottic narrowing. No abnormal prevertebral or retropharyngeal soft tissue swelling or abnormal air collections. The cervical vertebral bodies are unremarkable. The lung apices are grossly clear. IMPRESSION: Unremarkable soft tissue examination of the neck. Electronically Signed   By: Marijo Sanes M.D.   On: 11/27/2018 09:21    Assessment and Plan:   Adolf Ormiston Jahvon Gosline is a 48 y.o. y/o male who comes in today with some dysphagia regurgitation and choking.  The patient has been told that his symptoms may be related to reflux and he has been told to stop the Prilosec and he will be started on Protonix 40 mg to be taken in the evening.  He will also be set up for a upper endoscopy to rule out any strictures or narrowing.  The patient has been told that if his symptoms continue and the medication does not help then he may need a evaluation by ENT and possibly a modified barium swallow.  The patient has been  explained the plan and agrees with it.    Lucilla Lame, MD. Marval Regal    Note: This dictation was prepared with Dragon dictation along with smaller phrase technology. Any transcriptional errors that result from this process are unintentional.

## 2018-12-03 NOTE — H&P (View-Only) (Signed)
Gastroenterology Consultation  Referring Provider:     Sandrea Hughs, NP Primary Care Physician:  Sandrea Hughs, NP Primary Gastroenterologist:  Dr. Servando Snare     Reason for Consultation:     Dysphagia        HPI:   Brian Robertson is a 48 y.o. y/o male referred for consultation & management of dysphagia by Dr. Sandrea Hughs, NP.  This patient comes in today with a report of dysphagia.  The patient states his symptoms started on October 8.  The patient states that since then he has not been able to eat like he did before and has lost 45 pounds.  The patient denies any change in bowel habits such as constipation or diarrhea.  He also denies any abdominal pain associated with his symptoms.  He reports that when he lays down at night he gets choked and at certain times he has had to have the Heimlich maneuver done because food gets stuck on the way down.  The patient denies any hematemesis.  He states that both liquids and solids are an issue.  He also reports that his throat is always dry.  There is no overt heartburn that he can attest to.  The patient denies any family history of colon cancer or colon polyps. The patient's entire history is gotten through a interpreter.  The patient also is here with his wife who also does not speak [place].  Besides having the regurgitation and dry throat he also reports that he has modified his diet to eat very few things since he is worried that they may choke him.  The patient was started on omeprazole 20 mg and does not report any difference with that.  Past Medical History:  Diagnosis Date  . Diabetes mellitus without complication [facility])     Past Surgical History:  Procedure Laterality Date  . FOREARM FRACTURE SURGERY Left     Prior to Admission medications   Medication Sig Start Date End Date Taking? Authorizing Provider  omeprazole (PRILOSEC) 20 MG capsule Take 20 mg by mouth daily.   Yes [provider]  benzonatate (TESSALON)  100 MG capsule Take 1 capsule (100 mg total) by mouth 3 (three) times daily. Patient not taking: Reported on 12/03/2018 06/25/18   Osvaldo Shipper, MD  famotidine (PEPCID) 20 MG tablet Take 1 tablet (20 mg total) by mouth daily for 7 days. 06/25/18 07/02/18  Osvaldo Shipper, MD  guaiFENesin (ROBITUSSIN) 100 MG/5ML SOLN Take 5 mLs (100 mg total) by mouth every 4 (four) hours as needed for cough or to loosen phlegm. Patient not taking: Reported on 12/03/2018 06/25/18   Osvaldo Shipper, MD  metFORMIN (GLUCOPHAGE) 500 MG tablet Take 500 mg by mouth 2 (two) times daily with a meal.    [provider]  pantoprazole (PROTONIX) 40 MG tablet Take 1 tablet (40 mg total) by mouth at bedtime. 12/03/18   Midge Minium, MD  predniSONE (DELTASONE) 20 MG tablet Take 2 tablets orally twice daily for 3 days followed by 2 tablets orally once daily for 3 days followed by 1 tablet orally once daily for 3 days and then stop Patient not taking: Reported on 12/03/2018 06/25/18   Osvaldo Shipper, MD    History reviewed. No pertinent family history.   Social History   Tobacco Use  . Smoking status: Never Smoker  . Smokeless tobacco: Never Used  Substance Use Topics  . Alcohol use: No  . Drug use: Never    Allergies  as of 12/03/2018  . (No Known Allergies)    Review of Systems:    All systems reviewed and negative except where noted in HPI.   Physical Exam:  BP 113/79 (BP Location: Left Arm, Patient Position: Sitting)   Pulse 70   Temp 98.1 F (36.7 C) (Oral)   Ht 5\' 7"  (1.702 m)   Wt 249 lb (112.9 kg)   BMI 39.00 kg/m  No LMP for male patient. General:   Alert,  Well-developed, well-nourished, pleasant and cooperative in NAD Head:  Normocephalic and atraumatic. Eyes:  Sclera clear, no icterus.   Conjunctiva pink. Ears:  Normal auditory acuity. Neck:  Supple; no masses or thyromegaly. Lungs:  Respirations even and unlabored.  Clear throughout to auscultation.   No wheezes, crackles, or rhonchi. No  acute distress. Heart:  Regular rate and rhythm; no murmurs, clicks, rubs, or gallops. Abdomen:  Normal bowel sounds.  No bruits.  Soft, non-tender and non-distended without masses, hepatosplenomegaly or hernias noted.  No guarding or rebound tenderness.  Negative Carnett sign.   Rectal:  Deferred.  Msk:  Symmetrical without gross deformities.  Good, equal movement & strength bilaterally. Pulses:  Normal pulses noted. Extremities:  No clubbing or edema.  No cyanosis. Neurologic:  Alert and oriented x3;  grossly normal neurologically. Skin:  Intact without significant lesions or rashes.  No jaundice. Lymph Nodes:  No significant cervical adenopathy. Psych:  Alert and cooperative. Normal mood and affect.  Imaging Studies: Dg Neck Soft Tissue  Result Date: 11/27/2018 CLINICAL DATA:  Throat pain. EXAM: NECK SOFT TISSUES - 1+ VIEW COMPARISON:  None. FINDINGS: Normal appearance of the epiglottis and aryepiglottic folds. The vallecular airspace is maintained. No hypopharyngeal distension or subglottic narrowing. No abnormal prevertebral or retropharyngeal soft tissue swelling or abnormal air collections. The cervical vertebral bodies are unremarkable. The lung apices are grossly clear. IMPRESSION: Unremarkable soft tissue examination of the neck. Electronically Signed   By: Marijo Sanes M.D.   On: 11/27/2018 09:21    Assessment and Plan:   Brian Robertson is a 48 y.o. y/o male who comes in today with some dysphagia regurgitation and choking.  The patient has been told that his symptoms may be related to reflux and he has been told to stop the Prilosec and he will be started on Protonix 40 mg to be taken in the evening.  He will also be set up for a upper endoscopy to rule out any strictures or narrowing.  The patient has been told that if his symptoms continue and the medication does not help then he may need a evaluation by ENT and possibly a modified barium swallow.  The patient has been  explained the plan and agrees with it.    Lucilla Lame, MD. Marval Regal    Note: This dictation was prepared with Dragon dictation along with smaller phrase technology. Any transcriptional errors that result from this process are unintentional.

## 2018-12-04 ENCOUNTER — Other Ambulatory Visit
Admission: RE | Admit: 2018-12-04 | Discharge: 2018-12-04 | Disposition: A | Discharge status: Home / Self Care | Payer: MEDICAID | Source: Ambulatory Visit | Attending: Gastroenterology | Admitting: Gastroenterology

## 2018-12-04 DIAGNOSIS — Z01812 Encounter for preprocedural laboratory examination: Secondary | ICD-10-CM | POA: Insufficient documentation

## 2018-12-04 DIAGNOSIS — Z20828 Contact with and (suspected) exposure to other viral communicable diseases: Secondary | ICD-10-CM | POA: Insufficient documentation

## 2018-12-04 LAB — SARS CORONAVIRUS 2 (TAT 6-24 HRS): SARS Coronavirus 2: NEGATIVE

## 2018-12-06 ENCOUNTER — Ambulatory Visit: Payer: BC Managed Care – PPO | Admitting: Anesthesiology

## 2018-12-06 ENCOUNTER — Ambulatory Visit
Admission: RE | Admit: 2018-12-06 | Discharge: 2018-12-06 | Disposition: A | Discharge status: Home / Self Care | Payer: BC Managed Care – PPO | Attending: Gastroenterology | Admitting: Gastroenterology

## 2018-12-06 ENCOUNTER — Encounter: Payer: Self-pay | Admitting: Anesthesiology

## 2018-12-06 ENCOUNTER — Other Ambulatory Visit: Payer: Self-pay

## 2018-12-06 ENCOUNTER — Encounter
Admission: RE | Disposition: A | Discharge status: Home / Self Care | Payer: Self-pay | Source: Home / Self Care | Attending: Gastroenterology

## 2018-12-06 DIAGNOSIS — Z79899 Other long term (current) drug therapy: Secondary | ICD-10-CM | POA: Diagnosis not present

## 2018-12-06 DIAGNOSIS — Z7984 Long term (current) use of oral hypoglycemic drugs: Secondary | ICD-10-CM | POA: Diagnosis not present

## 2018-12-06 DIAGNOSIS — R1310 Dysphagia, unspecified: Secondary | ICD-10-CM | POA: Diagnosis not present

## 2018-12-06 DIAGNOSIS — K2900 Acute gastritis without bleeding: Secondary | ICD-10-CM

## 2018-12-06 DIAGNOSIS — B9681 Helicobacter pylori [H. pylori] as the cause of diseases classified elsewhere: Secondary | ICD-10-CM | POA: Insufficient documentation

## 2018-12-06 DIAGNOSIS — K2970 Gastritis, unspecified, without bleeding: Secondary | ICD-10-CM | POA: Insufficient documentation

## 2018-12-06 DIAGNOSIS — E119 Type 2 diabetes mellitus without complications: Secondary | ICD-10-CM | POA: Insufficient documentation

## 2018-12-06 DIAGNOSIS — K2100 Gastro-esophageal reflux disease with esophagitis, without bleeding: Secondary | ICD-10-CM

## 2018-12-06 HISTORY — PX: ESOPHAGOGASTRODUODENOSCOPY (EGD) WITH PROPOFOL: SHX5813

## 2018-12-06 SURGERY — ESOPHAGOGASTRODUODENOSCOPY (EGD) WITH PROPOFOL
Anesthesia: General

## 2018-12-06 MED ORDER — SODIUM CHLORIDE 0.9 % IV SOLN
INTRAVENOUS | Status: DC
  Administered 2018-12-06: 1000 mL via INTRAVENOUS

## 2018-12-06 MED ORDER — PROPOFOL 10 MG/ML IV BOLUS
INTRAVENOUS | Status: DC | PRN
  Administered 2018-12-06: 70 mg via INTRAVENOUS

## 2018-12-06 MED ORDER — PROPOFOL 500 MG/50ML IV EMUL
INTRAVENOUS | Status: DC | PRN
  Administered 2018-12-06: 140 ug/kg/min via INTRAVENOUS

## 2018-12-06 MED ORDER — LIDOCAINE HCL (CARDIAC) PF 100 MG/5ML IV SOSY
PREFILLED_SYRINGE | INTRAVENOUS | Status: DC | PRN
  Administered 2018-12-06: 100 mg via INTRAVENOUS

## 2018-12-06 MED ORDER — GLYCOPYRROLATE 0.2 MG/ML IJ SOLN
INTRAMUSCULAR | Status: DC | PRN
  Administered 2018-12-06: 0.2 mg via INTRAVENOUS

## 2018-12-06 NOTE — Transfer of Care (Signed)
Immediate Anesthesia Transfer of Care Note  Patient: Brian Robertson  Procedure(s) Performed: ESOPHAGOGASTRODUODENOSCOPY (EGD) WITH PROPOFOL (N/A )  Patient Location: PACU  Anesthesia Type:General  Level of Consciousness: awake, alert  and oriented  Airway & Oxygen Therapy: Patient Spontanous Breathing and Patient connected to nasal cannula oxygen  Post-op Assessment: Report given to RN and Post -op Vital signs reviewed and stable  Post vital signs: Reviewed  Last Vitals:  Vitals Value Taken Time  BP    Temp    Pulse 75 12/06/18 1024  Resp 24 12/06/18 1024  SpO2 93 % 12/06/18 1024  Vitals shown include unvalidated device data.  Last Pain:  Vitals:   12/06/18 0934  TempSrc: Skin  PainSc: 0-No pain         Complications: No apparent anesthesia complications

## 2018-12-06 NOTE — Op Note (Signed)
[facility] Gastroenterology Patient Name: Brian Robertson Procedure Date: 12/06/2018 10:00 AM MRN: [id number] Account #: [id number] Date of Birth: February 23, 1970 Admit Type: Outpatient Age: 48 Room: [facility] ENDO ROOM 4 Gender: Male Note Status: Finalized Procedure:             Upper GI endoscopy Indications:           Dysphagia Providers:             Midge Minium MD, MD Medicines:             Propofol per Anesthesia Complications:         No immediate complications. Procedure:             Pre-Anesthesia Assessment:                        - Prior to the procedure, a History and Physical was                         performed, and patient medications and allergies were                         reviewed. The patient's tolerance of previous                         anesthesia was also reviewed. The risks and benefits                         of the procedure and the sedation options and risks                         were discussed with the patient. All questions were                         answered, and informed consent was obtained. Prior                         Anticoagulants: The patient has taken no previous                         anticoagulant or antiplatelet agents. ASA Grade                         Assessment: II - A patient with mild systemic disease.                         After reviewing the risks and benefits, the patient                         was deemed in satisfactory condition to undergo the                         procedure.                        After obtaining informed consent, the endoscope was                         passed under direct vision. Throughout the procedure,  the patient's blood pressure, pulse, and oxygen                         saturations were monitored continuously. The Endoscope                         was introduced through the mouth, and advanced to the                         second part of duodenum. The  upper GI endoscopy was                         accomplished without difficulty. The patient tolerated                         the procedure well. Findings:      The examined esophagus was normal. A TTS dilator was passed through the       scope. Dilation with a 15-16.5-18 mm balloon dilator was performed to 18       mm. The dilation site was examined following endoscope reinsertion and       showed no change. Two biopsies were obtained in the middle third of the       esophagus with cold forceps for histology.      Localized moderate inflammation characterized by erythema was found in       the gastric antrum. Biopsies were taken with a cold forceps for       histology.      The examined duodenum was normal. Impression:            - Normal esophagus. Dilated.                        - Gastritis. Biopsied.                        - Normal examined duodenum.                        - Two biopsies were obtained in the middle third of                         the esophagus. Recommendation:        - Discharge patient to home.                        - Resume previous diet.                        - Continue present medications.                        - Perform a modified barium swallow at appointment to                         be scheduled. Procedure Code(s):     --- Professional ---                        [phone], Esophagogastroduodenoscopy, flexible,  transoral; with transendoscopic balloon dilation of                         esophagus (less than 30 mm diameter)                        43239, 59, Esophagogastroduodenoscopy, flexible,                         transoral; with biopsy, single or multiple Diagnosis Code(s):     --- Professional ---                        R13.10, Dysphagia, unspecified                        K29.70, Gastritis, unspecified, without bleeding CPT copyright 2019 American Medical Association. All rights reserved. The codes documented in this report  are preliminary and upon coder review may  be revised to meet current compliance requirements. Midge Miniumarren Gionni Freese MD, MD 12/06/2018 10:22:28 AM This report has been signed electronically. Number of Addenda: 0 Note Initiated On: 12/06/2018 10:00 AM Estimated Blood Loss:  Estimated blood loss: none.      [facility]

## 2018-12-06 NOTE — Interval H&P Note (Signed)
History and Physical Interval Note:  12/06/2018 9:28 AM  Brian Robertson  has presented today for surgery, with the diagnosis of GERD K21.9.  The various methods of treatment have been discussed with the patient and family. After consideration of risks, benefits and other options for treatment, the patient has consented to  Procedure(s): ESOPHAGOGASTRODUODENOSCOPY (EGD) WITH PROPOFOL (N/A) as a surgical intervention.  The patient's history has been reviewed, patient examined, no change in status, stable for surgery.  I have reviewed the patient's chart and labs.  Questions were answered to the patient's satisfaction.     Garion Wempe [employer]

## 2018-12-06 NOTE — Anesthesia Postprocedure Evaluation (Signed)
Anesthesia Post Note  Patient: Tayjon Halladay  Procedure(s) Performed: ESOPHAGOGASTRODUODENOSCOPY (EGD) WITH PROPOFOL (N/A )  Patient location during evaluation: Endoscopy Anesthesia Type: General Level of consciousness: awake and alert Pain management: pain level controlled Vital Signs Assessment: post-procedure vital signs reviewed and stable Respiratory status: spontaneous breathing, nonlabored ventilation, respiratory function stable and patient connected to nasal cannula oxygen Cardiovascular status: blood pressure returned to baseline and stable Postop Assessment: no apparent nausea or vomiting Anesthetic complications: no     Last Vitals:  Vitals:   12/06/18 1045 12/06/18 1050  BP: 111/67   Pulse: (!) 51 (!) 48  Resp: 17 20  Temp:    SpO2: 97% 98%    Last Pain:  Vitals:   12/06/18 1025  TempSrc: Temporal  PainSc:                  Precious Haws Ziare Orrick

## 2018-12-06 NOTE — Anesthesia Post-op Follow-up Note (Signed)
Anesthesia QCDR form completed.        

## 2018-12-06 NOTE — Anesthesia Preprocedure Evaluation (Addendum)
Anesthesia Evaluation  Patient identified by MRN, date of birth, ID band Patient awake    Reviewed: Allergy & Precautions, H&P , NPO status , Patient's Chart, lab work & pertinent test results  History of Anesthesia Complications Negative for: history of anesthetic complications  Airway Mallampati: III  TM Distance: <3 FB Neck ROM: limited    Dental  (+) Chipped, Poor Dentition, Missing, Implants   Pulmonary neg shortness of breath,  Signs and symptoms suggestive of sleep apnea            Cardiovascular Exercise Tolerance: Good (-) angina(-) Past MI and (-) DOE negative cardio ROS       Neuro/Psych negative neurological ROS  negative psych ROS   GI/Hepatic negative GI ROS, Neg liver ROS, neg GERD  ,  Endo/Other  diabetes, Type 2  Renal/GU negative Renal ROS  negative genitourinary   Musculoskeletal   Abdominal   Peds  Hematology negative hematology ROS (+)   Anesthesia Other Findings Past Medical History: No date: Diabetes mellitus without complication ([place])  Past Surgical History: No date: FOREARM FRACTURE SURGERY; Left     Reproductive/Obstetrics negative OB ROS                            Anesthesia Physical Anesthesia Plan  ASA: III  Anesthesia Plan: General   Post-op Pain Management:    Induction: Intravenous  PONV Risk Score and Plan: Propofol infusion and TIVA  Airway Management Planned: Natural Airway and Nasal Cannula  Additional Equipment:   Intra-op Plan:   Post-operative Plan:   Informed Consent: I have reviewed the patients History and Physical, chart, labs and discussed the procedure including the risks, benefits and alternatives for the proposed anesthesia with the patient or authorized representative who has indicated his/her understanding and acceptance.     Dental Advisory Given  Plan Discussed with: Anesthesiologist, CRNA and  Surgeon  Anesthesia Plan Comments: (History and consent via interpreter   Patient consented for risks of anesthesia including but not limited to:  - adverse reactions to medications - risk of intubation if required - damage to teeth, lips or other oral mucosa - sore throat or hoarseness - Damage to heart, brain, lungs or loss of life  Patient voiced understanding.)       Anesthesia Quick Evaluation

## 2018-12-09 ENCOUNTER — Encounter: Payer: Self-pay | Admitting: Gastroenterology

## 2018-12-10 LAB — SURGICAL PATHOLOGY

## 2018-12-11 ENCOUNTER — Encounter: Payer: Self-pay | Admitting: Gastroenterology

## 2018-12-16 ENCOUNTER — Telehealth: Payer: Self-pay | Admitting: Gastroenterology

## 2018-12-16 NOTE — Telephone Encounter (Signed)
Pt neighbor left vm regarding pt who does not speak english she wants [place] to contatc pt son  Pt was diagnosed with H-poliri  and needs treatment

## 2018-12-18 ENCOUNTER — Other Ambulatory Visit: Payer: Self-pay

## 2018-12-18 NOTE — Telephone Encounter (Signed)
Pt's daughter stated pt's PCP called in the antibiotics for his H pylori.

## 2018-12-26 ENCOUNTER — Ambulatory Visit: Payer: BC Managed Care – PPO | Admitting: Gastroenterology

## 2018-12-31 ENCOUNTER — Other Ambulatory Visit: Payer: Self-pay | Admitting: Internal Medicine

## 2018-12-31 DIAGNOSIS — R7401 Elevation of levels of liver transaminase levels: Secondary | ICD-10-CM

## 2019-01-07 ENCOUNTER — Ambulatory Visit
Admission: RE | Admit: 2019-01-07 | Discharge: 2019-01-07 | Disposition: A | Discharge status: Home / Self Care | Payer: BC Managed Care – PPO | Source: Ambulatory Visit | Attending: Internal Medicine | Admitting: Internal Medicine

## 2019-01-07 ENCOUNTER — Other Ambulatory Visit: Payer: Self-pay

## 2019-01-07 DIAGNOSIS — R7401 Elevation of levels of liver transaminase levels: Secondary | ICD-10-CM

## 2019-02-02 NOTE — Progress Notes (Signed)
Primary Care Physician: Sandrea Hughs, NP  Primary Gastroenterologist:  Dr. Midge Minium  Chief Complaint  Patient presents with  . Follow-up    HPI: Brian Robertson is a 49 y.o. male here for follow-up after having an EGD.  The patient was seen by me back in November 2020 and at that time reported that he was unable to eat anything and had a 45 pound weight loss.  The patient had an upper endoscopy with a normal esophagus and no cause for his symptoms seen.  The patient had biopsies of the stomach that showed him to have H. pylori.  The esophagus biopsies were all normal.  The patient underwent a ultrasound of the right upper quadrant that just showed fatty liver.  The patient has a history of abnormal liver enzymes and is liver enzymes have shown:  Component     Latest Ref Rng & Units 06/24/2018 06/25/2018 11/27/2018  Albumin     3.5 - 5.0 g/dL 3.2 (L) 3.2 (L) 4.6  AST     15 - 41 U/L 47 (H) 58 (H) 40  ALT     0 - 44 U/L 140 (H) 146 (H) 73 (H)  Alkaline Phosphatase     38 - 126 U/L 73 69 51  Total Bilirubin     0.3 - 1.2 mg/dL 0.4 0.5 1.3 (H)    The patient was contacted after the procedure and attempted to start antibiotics for his H. pylori but the patient's daughter had reported that the primary care provider had already started him on antibiotics. The patient reports that he has continued to lose weight but now he states it is due to a diet.  He is now trying to lose weight.  The patient's entire interview was done through a translator since the patient does not speak any [place].  The patient reports that he is still avoiding most meats and fatty foods in an effort to decrease his fatty liver.  Current Outpatient Medications  Medication Sig Dispense Refill  . metFORMIN (GLUCOPHAGE) 500 MG tablet Take 500 mg by mouth 2 (two) times daily with a meal.    . omeprazole (PRILOSEC) 20 MG capsule Take 20 mg by mouth daily.    . pantoprazole (PROTONIX) 40 MG tablet Take 1  tablet (40 mg total) by mouth at bedtime. 30 tablet 6  . famotidine (PEPCID) 20 MG tablet Take 1 tablet (20 mg total) by mouth daily for 7 days. 7 tablet 0   No current facility-administered medications for this visit.    Allergies as of 02/03/2019  . (No Known Allergies)    ROS:  General: Negative for anorexia, weight loss, fever, chills, fatigue, weakness. ENT: Negative for hoarseness, difficulty swallowing , nasal congestion. CV: Negative for chest pain, angina, palpitations, dyspnea on exertion, peripheral edema.  Respiratory: Negative for dyspnea at rest, dyspnea on exertion, cough, sputum, wheezing.  GI: See history of present illness. GU:  Negative for dysuria, hematuria, urinary incontinence, urinary frequency, nocturnal urination.  Endo: Negative for unusual weight change.    Physical Examination:   BP 105/71   Pulse 70   Temp 97.8 F (36.6 C) (Temporal)   Ht 5\' 7"  (1.702 m)   Wt 231 lb 9.6 oz (105.1 kg)   BMI 36.27 kg/m   General: Well-nourished, well-developed in no acute distress.  Eyes: No icterus. Conjunctivae pink. Lungs: Clear to auscultation bilaterally. Non-labored. Heart: Regular rate and rhythm, no murmurs rubs or gallops.  Abdomen: Bowel  sounds are normal, nontender, nondistended, no hepatosplenomegaly or masses, no abdominal bruits or hernia , no rebound or guarding.   Extremities: No lower extremity edema. No clubbing or deformities. Neuro: Alert and oriented x 3.  Grossly intact. Skin: Warm and dry, no jaundice.   Psych: Alert and cooperative, normal mood and affect.  Labs:    Imaging Studies: US Abdomen Complete  Result Date: 01/07/2019 CLINICAL DATA:  Hepatic steatosis EXAM: ABDOMEN ULTRASOUND COMPLETE COMPARISON:  None. FINDINGS: Gallbladder: No gallstones or wall thickening visualized. There is no pericholecystic fluid. No sonographic Murphy sign noted by sonographer. Common bile duct: Diameter: 3 mm. No intrahepatic, common hepatic, or  common bile duct dilatation. Liver: No focal lesion identified. Liver echogenicity is increased diffusely. Portal vein is patent on color Doppler imaging with normal direction of blood flow towards the liver. IVC: No abnormality visualized. Pancreas: Visualized portion unremarkable. Portions of pancreas obscured by gas. Spleen: Size and appearance within normal limits. Right Kidney: Length: 10.5 cm. Echogenicity within normal limits. No mass or hydronephrosis visualized. Left Kidney: Length: 11.8 cm. Echogenicity within normal limits. No mass or hydronephrosis visualized. Abdominal aorta: No aneurysm visualized. Other findings: No demonstrable ascites. IMPRESSION: 1. Diffuse increase in liver echogenicity, a finding indicative of hepatic steatosis. While no focal liver lesions are evident on this study, it must be cautioned that the sensitivity of ultrasound for detection of focal liver lesions is diminished in this circumstance. 2. Portions of pancreas obscured by gas. Visualized portions of pancreas appear normal. 3.  Study otherwise unremarkable. Electronically Signed   By: Lowella Grip III M.D.   On: 01/07/2019 09:44    Assessment and Plan:   Brian Robertson is a 49 y.o. y/o male for follow-up of his nausea vomiting weight loss.  The patient was treated for H. pylori and states that he is feeling much better.  The patient now reports that he is losing weight because he is trying very hard to lose weight.  He reports that he did have some inflammation feeling in his throat but he reports that to be better.  The patient is now wondering what he can eat and is asking for a diet for his reflux and fatty liver.  The patient already does not eat for 4 hours before he goes to sleep and he takes his pantoprazole at night.  There is no acid breakthrough.  The patient has been told about the [place] and that his liver enzymes have started to come down already.  He will also have a follow-up  test to make sure he has eradicated his H. pylori.  The patient has been explained the plan and agrees with it.    Lucilla Lame, MD. Marval Regal    Note: This dictation was prepared with Dragon dictation along with smaller phrase technology. Any transcriptional errors that result from this process are unintentional.

## 2019-02-03 ENCOUNTER — Other Ambulatory Visit: Payer: Self-pay

## 2019-02-03 ENCOUNTER — Encounter: Payer: Self-pay | Admitting: Gastroenterology

## 2019-02-03 ENCOUNTER — Ambulatory Visit (INDEPENDENT_AMBULATORY_CARE_PROVIDER_SITE_OTHER): Payer: BC Managed Care – PPO | Admitting: Gastroenterology

## 2019-02-03 VITALS — BP 105/71 | HR 70 | Temp 97.8°F | Ht 67.0 in | Wt 231.6 lb

## 2019-02-03 DIAGNOSIS — A048 Other specified bacterial intestinal infections: Secondary | ICD-10-CM | POA: Diagnosis not present

## 2019-06-24 ENCOUNTER — Other Ambulatory Visit: Payer: Self-pay | Admitting: Gastroenterology

## 2019-12-17 ENCOUNTER — Other Ambulatory Visit: Payer: Self-pay | Admitting: Gastroenterology

## 2020-07-22 IMAGING — US US ABDOMEN COMPLETE
1 series · 13 of 25 positions shown · non-contrast
Comparison: None.

CLINICAL DATA: Hepatic steatosis

EXAM:
ABDOMEN ULTRASOUND COMPLETE

[Series 1: us abdomen complete · 0.25mm/px · 13 of 71 slices shown]
[im 1/71]
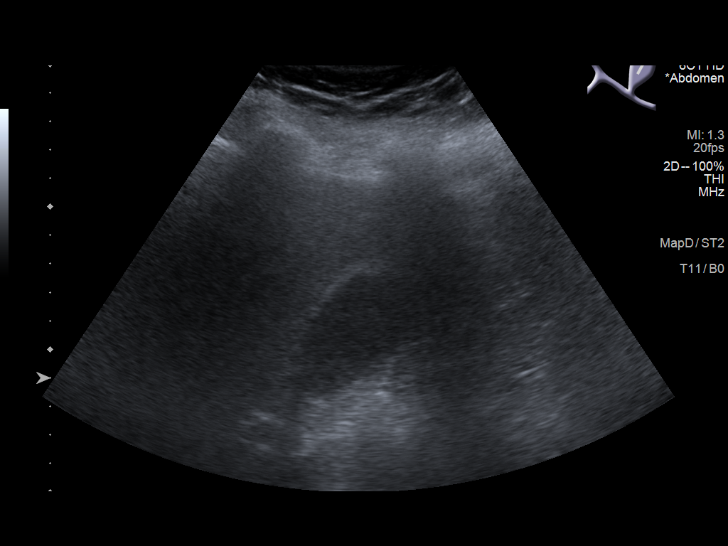
[im 6/71]
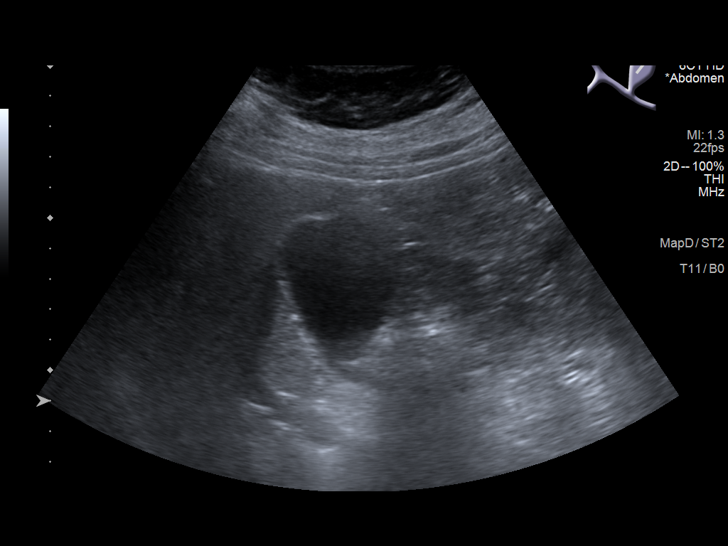
[im 12/71]
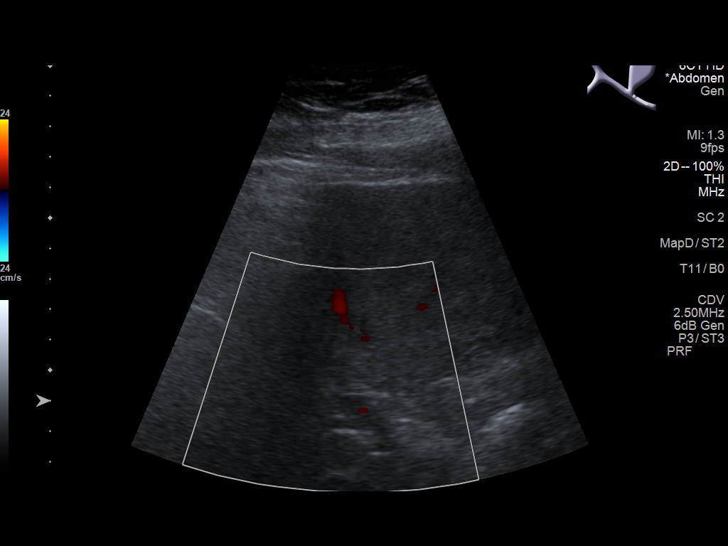
[im 18/71]
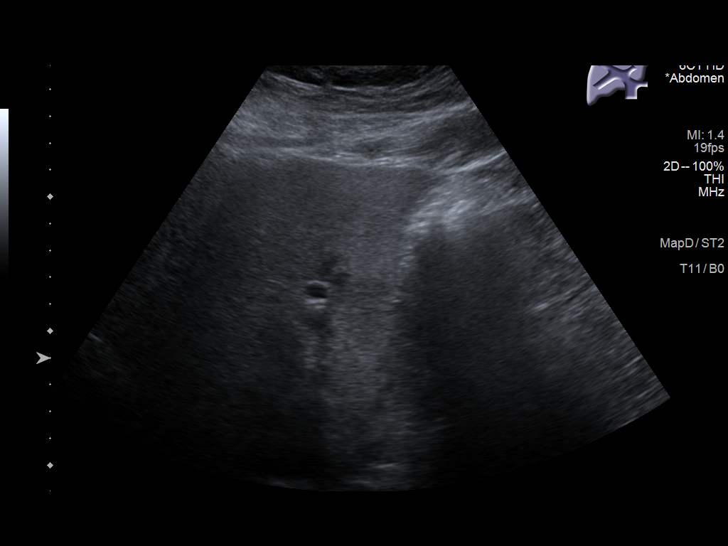
[im 24/71]
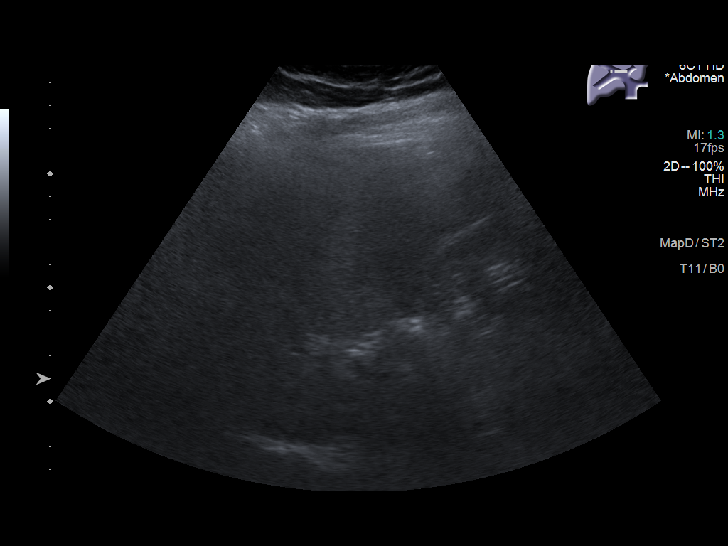
[im 30/71]
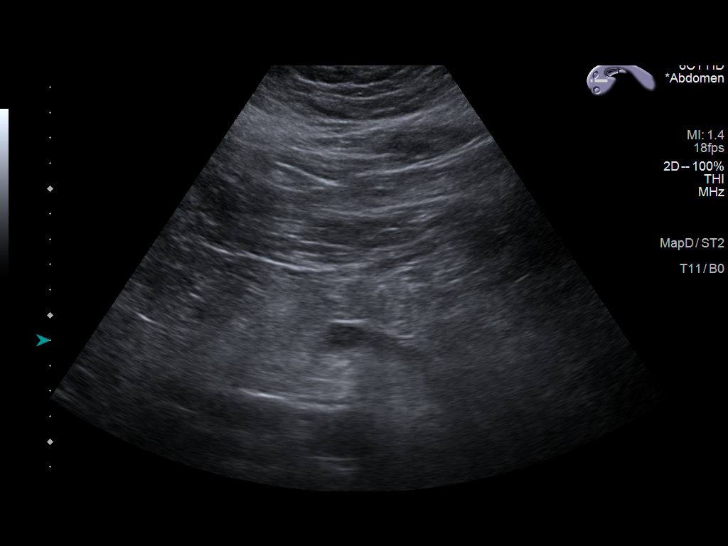
[im 36/71]
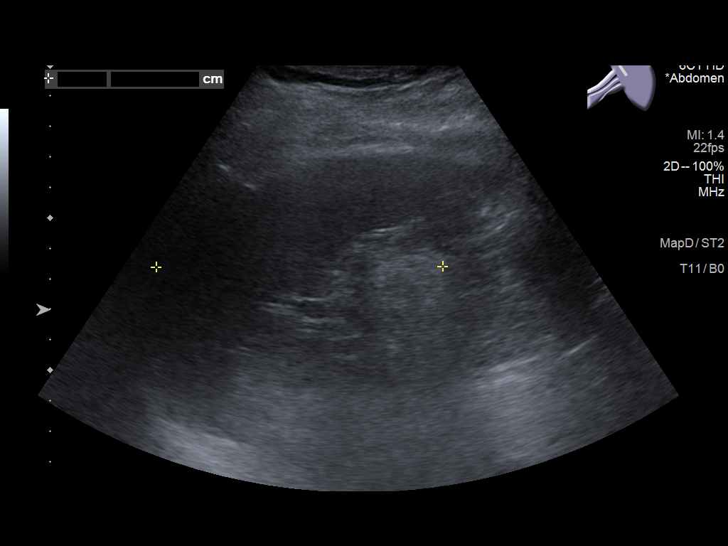
[im 41/71]
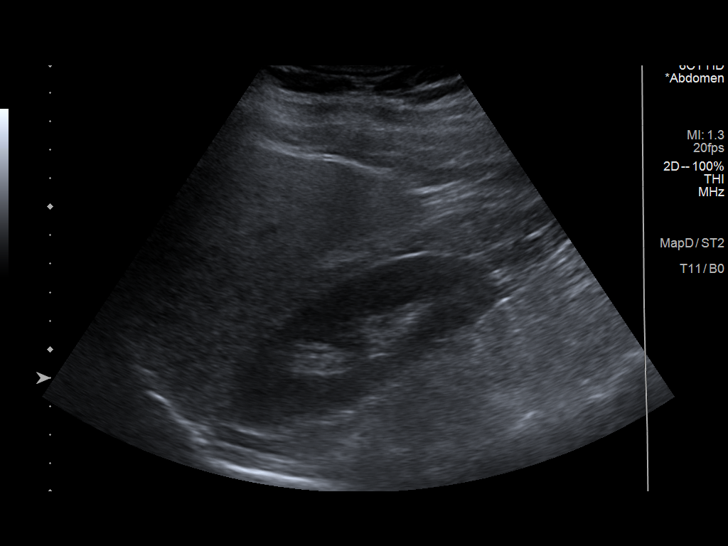
[im 47/71]
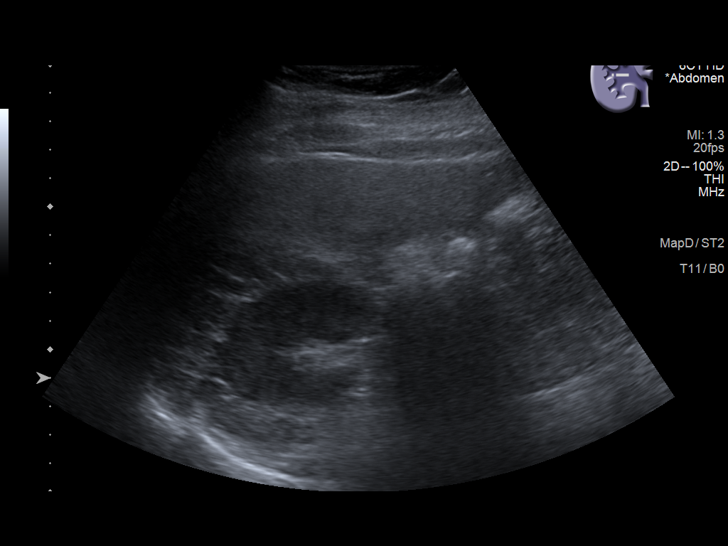
[im 53/71]
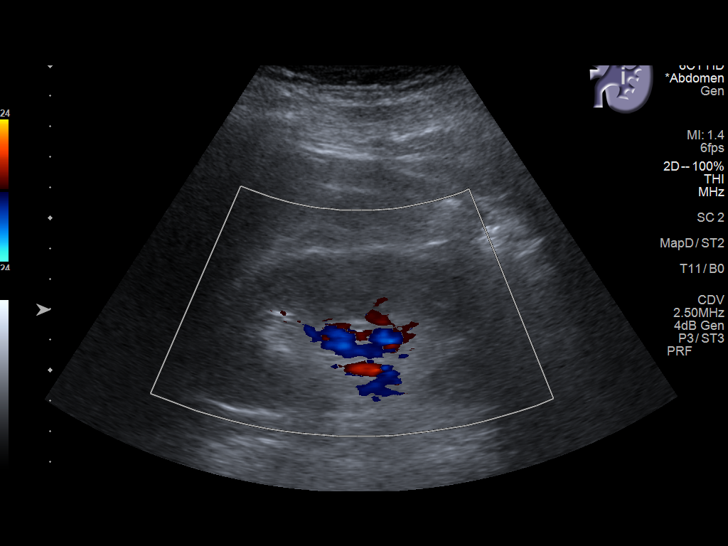
[im 59/71]
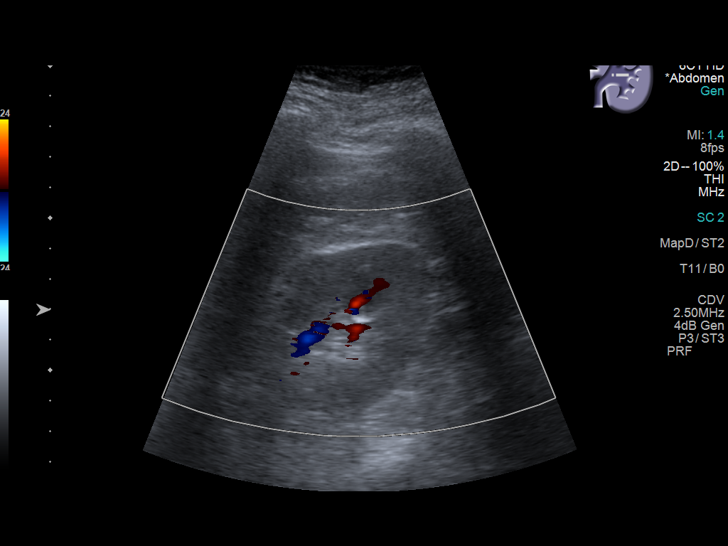
[im 65/71]
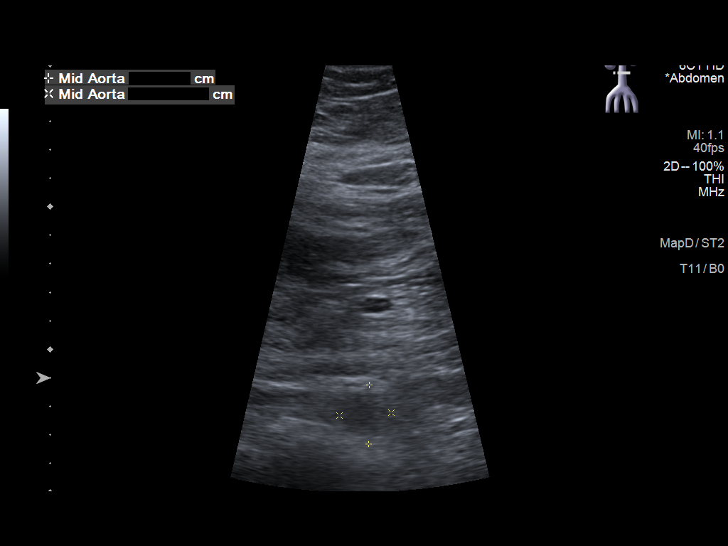
[im 71/71]
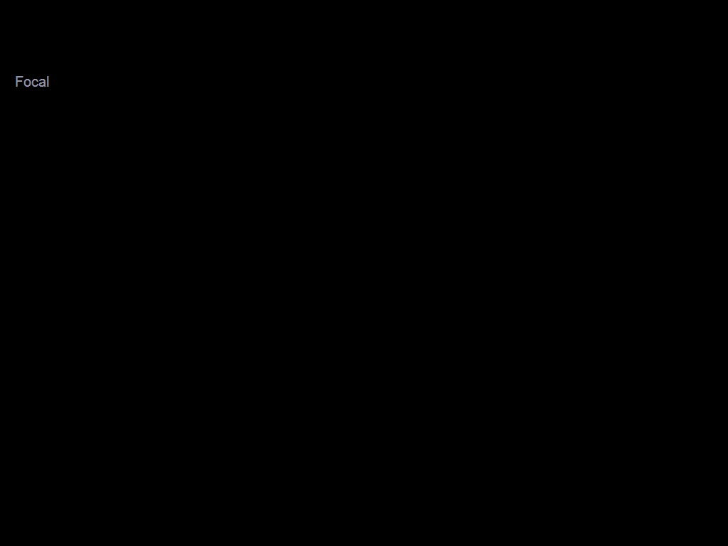

[13 of 25 positions shown; findings below may reference images not displayed]

FINDINGS: Gallbladder: No gallstones or wall thickening visualized. There is
no pericholecystic fluid. No sonographic Murphy sign noted by
sonographer.

Common bile duct: Diameter: 3 mm. No intrahepatic, common hepatic,
or common bile duct dilatation.

Liver: No focal lesion identified. Liver echogenicity is increased
diffusely. Portal vein is patent on color Doppler imaging with
normal direction of blood flow towards the liver.

IVC: No abnormality visualized.

Pancreas: Visualized portion unremarkable. Portions of pancreas
obscured by gas.

Spleen: Size and appearance within normal limits.

Right Kidney: Length: 10.5 cm. Echogenicity within normal limits. No
mass or hydronephrosis visualized.

Left Kidney: Length: 11.8 cm. Echogenicity within normal limits. No
mass or hydronephrosis visualized.

Abdominal aorta: No aneurysm visualized.

Other findings: No demonstrable ascites.
IMPRESSION: 1. Diffuse increase in liver echogenicity, a finding indicative of
hepatic steatosis. While no focal liver lesions are evident on this
study, it must be cautioned that the sensitivity of ultrasound for
detection of focal liver lesions is diminished in this circumstance.

2. Portions of pancreas obscured by gas. Visualized portions of
pancreas appear normal.

3.  Study otherwise unremarkable.

## 2021-08-08 ENCOUNTER — Other Ambulatory Visit: Payer: Self-pay

## 2021-08-08 ENCOUNTER — Encounter: Payer: Self-pay | Admitting: *Deleted

## 2021-08-08 ENCOUNTER — Emergency Department: Payer: 59

## 2021-08-08 DIAGNOSIS — Z5321 Procedure and treatment not carried out due to patient leaving prior to being seen by health care provider: Secondary | ICD-10-CM | POA: Insufficient documentation

## 2021-08-08 DIAGNOSIS — Z20822 Contact with and (suspected) exposure to covid-19: Secondary | ICD-10-CM | POA: Diagnosis not present

## 2021-08-08 DIAGNOSIS — R509 Fever, unspecified: Secondary | ICD-10-CM | POA: Diagnosis present

## 2021-08-08 DIAGNOSIS — R519 Headache, unspecified: Secondary | ICD-10-CM | POA: Insufficient documentation

## 2021-08-08 DIAGNOSIS — M791 Myalgia, unspecified site: Secondary | ICD-10-CM | POA: Insufficient documentation

## 2021-08-08 LAB — CBC WITH DIFFERENTIAL/PLATELET
Abs Immature Granulocytes: 0.02 10*3/uL (ref 0.00–0.07)
Basophils Absolute: 0.1 10*3/uL (ref 0.0–0.1)
Basophils Relative: 1 %
Eosinophils Absolute: 0.1 10*3/uL (ref 0.0–0.5)
Eosinophils Relative: 1 %
HCT: 46.7 % (ref 39.0–52.0)
Hemoglobin: 15.4 g/dL (ref 13.0–17.0)
Immature Granulocytes: 0 %
Lymphocytes Relative: 13 %
Lymphs Abs: 1.1 10*3/uL (ref 0.7–4.0)
MCH: 29.2 pg (ref 26.0–34.0)
MCHC: 33 g/dL (ref 30.0–36.0)
MCV: 88.4 fL (ref 80.0–100.0)
Monocytes Absolute: 1.3 10*3/uL — ABNORMAL HIGH (ref 0.1–1.0)
Monocytes Relative: 15 %
Neutro Abs: 6.1 10*3/uL (ref 1.7–7.7)
Neutrophils Relative %: 70 %
Platelets: 179 10*3/uL (ref 150–400)
RBC: 5.28 MIL/uL (ref 4.22–5.81)
RDW: 12.4 % (ref 11.5–15.5)
WBC: 8.6 10*3/uL (ref 4.0–10.5)
nRBC: 0 % (ref 0.0–0.2)

## 2021-08-08 LAB — URINALYSIS, ROUTINE W REFLEX MICROSCOPIC
Bacteria, UA: NONE SEEN
Bilirubin Urine: NEGATIVE
Glucose, UA: NEGATIVE mg/dL
Ketones, ur: 5 mg/dL — AB
Leukocytes,Ua: NEGATIVE
Nitrite: NEGATIVE
Protein, ur: 30 mg/dL — AB
Specific Gravity, Urine: 1.019 (ref 1.005–1.030)
Squamous Epithelial / HPF: NONE SEEN (ref 0–5)
pH: 5 (ref 5.0–8.0)

## 2021-08-08 LAB — COMPREHENSIVE METABOLIC PANEL
ALT: 38 U/L (ref 0–44)
AST: 27 U/L (ref 15–41)
Albumin: 4.2 g/dL (ref 3.5–5.0)
Alkaline Phosphatase: 57 U/L (ref 38–126)
Anion gap: 6 (ref 5–15)
BUN: 8 mg/dL (ref 6–20)
CO2: 25 mmol/L (ref 22–32)
Calcium: 8.9 mg/dL (ref 8.9–10.3)
Chloride: 106 mmol/L (ref 98–111)
Creatinine, Ser: 1.08 mg/dL (ref 0.61–1.24)
GFR, Estimated: >60 mL/min (ref >60)
Glucose, Bld: 139 mg/dL — ABNORMAL HIGH (ref 70–99)
Potassium: 3.8 mmol/L (ref 3.5–5.1)
Sodium: 137 mmol/L (ref 135–145)
Total Bilirubin: 0.9 mg/dL (ref 0.3–1.2)
Total Protein: 7.6 g/dL (ref 6.5–8.1)

## 2021-08-08 LAB — RESP PANEL BY RT-PCR (FLU A&B, COVID) ARPGX2
Influenza A by PCR: NEGATIVE
Influenza B by PCR: NEGATIVE
SARS Coronavirus 2 by RT PCR: NEGATIVE

## 2021-08-08 NOTE — ED Provider Triage Note (Signed)
  Emergency Medicine Provider Triage Evaluation Note  Brian Robertson , a 51 y.o.male,  was evaluated in triage.  Pt complains of fever x2 days.  Reports a mild headache and some body aches, but otherwise does not have any other symptoms.  Denies chest pain, shortness of breath, or cough/congestion.  He states he has been taking Tylenol for the fever.   Review of Systems  Positive: Fever, myalgias, headache Negative: Denies abdominal pain, chest pain, vomiting  Physical Exam   Vitals:   08/08/21 2233  BP: (!) 125/95  Pulse: 90  Resp: 18  Temp: 99.1 F (37.3 C)  SpO2: 98%   Gen:   Awake, no distress   Resp:  Normal effort  MSK:   Moves extremities without difficulty  Other:    Medical Decision Making  Given the patient's initial medical screening exam, the following diagnostic evaluation has been ordered. The patient will be placed in the appropriate treatment space, once one is available, to complete the evaluation and treatment. I have discussed the plan of care with the patient and I have advised the patient that an ED physician or mid-level practitioner will reevaluate their condition after the test results have been received, as the results may give them additional insight into the type of treatment they may need.    Diagnostics: Labs, UA, CXR  Treatments: none immediately   Varney Daily, [place] 08/08/21 2243

## 2021-08-08 NOTE — ED Triage Notes (Signed)
Pt reports a fever since yesterday.  No cough.  No n/v/  pt reports a headache.  Pt alert  speech clear.  Interpreter on a stick used in triage.

## 2021-08-09 ENCOUNTER — Emergency Department
Admission: EM | Admit: 2021-08-09 | Discharge: 2021-08-09 | Disposition: A | Discharge status: Home / Self Care | Payer: 59 | Attending: Emergency Medicine | Admitting: Emergency Medicine

## 2021-08-09 ENCOUNTER — Encounter: Payer: Self-pay | Admitting: Medical Oncology

## 2021-08-09 ENCOUNTER — Emergency Department
Admission: EM | Admit: 2021-08-09 | Discharge: 2021-08-09 | Discharge status: Against Medical Advice | Payer: 59 | Attending: Emergency Medicine | Admitting: Emergency Medicine

## 2021-08-09 DIAGNOSIS — J181 Lobar pneumonia, unspecified organism: Secondary | ICD-10-CM | POA: Insufficient documentation

## 2021-08-09 DIAGNOSIS — R519 Headache, unspecified: Secondary | ICD-10-CM | POA: Insufficient documentation

## 2021-08-09 DIAGNOSIS — M791 Myalgia, unspecified site: Secondary | ICD-10-CM | POA: Diagnosis not present

## 2021-08-09 DIAGNOSIS — R509 Fever, unspecified: Secondary | ICD-10-CM | POA: Diagnosis present

## 2021-08-09 DIAGNOSIS — J189 Pneumonia, unspecified organism: Secondary | ICD-10-CM

## 2021-08-09 MED ORDER — AZITHROMYCIN 250 MG PO TABS: 250.0000 mg | ORAL_TABLET | Freq: Every day | ORAL | 0 refills | Status: AC

## 2021-08-09 MED ORDER — AZITHROMYCIN 500 MG PO TABS
500.0000 mg | ORAL_TABLET | Freq: Once | ORAL | Status: AC
  Administered 2021-08-09: 500 mg via ORAL
  Filled 2021-08-09: qty 1

## 2021-08-09 NOTE — ED Triage Notes (Signed)
Pt reports fever, headache, body aches x 2 days. Interpreter used.

## 2021-08-09 NOTE — Discharge Instructions (Addendum)
Take the antibiotic as directed. Follow-up with your primary provider for repeat chest X-ray in 4 weeks. Return to the emergency department for any worsening symptoms.   Tome el antibitico segn las indicaciones. Haga un seguimiento con su proveedor primario para repetir la radiografa de trax en 4 semanas. Regrese al departamento de emergencias por cualquier empeoramiento de los sntomas.

## 2021-08-09 NOTE — ED Notes (Signed)
No answer when called several times from lobby 

## 2021-08-09 NOTE — ED Provider Notes (Addendum)
[facility] Emergency Department Provider Note     Event Date/Time   First MD Initiated Contact with Patient 08/09/21 1111     (approximate)   History   Fever   HPI  History limited by Spanish language.  Brian Robertson is a 51 y.o. male returns to the ED after presenting last night for evaluation of headache, fever, and body aches.  Patient apparently left prior to being seen yesterday.  He returns today noting ongoing symptoms.  He denies any cough, chest pain, or shortness of breath.     Physical Exam   Triage Vital Signs: ED Triage Vitals  Enc Vitals Group     BP 08/09/21 1039 139/90     Pulse Rate 08/09/21 1039 61     Resp 08/09/21 1039 16     Temp 08/09/21 1039 98.9 F (37.2 C)     Temp Source 08/09/21 1039 Oral     SpO2 08/09/21 1039 97 %     Weight 08/09/21 1037 231 lb 7.7 oz (105 kg)     Height 08/09/21 1037 5\' 7"  (1.702 m)     Head Circumference --      Peak Flow --      Pain Score 08/09/21 1036 0     Pain Loc --      Pain Edu? --      Excl. in GC? --     Most recent vital signs: Vitals:   08/09/21 1039  BP: 139/90  Pulse: 61  Resp: 16  Temp: 98.9 F (37.2 C)  SpO2: 97%    General Awake, no distress. NAD HEENT NCAT. PERRL. EOMI. No rhinorrhea. Mucous membranes are moist.  CV:  Good peripheral perfusion.  RESP:  Normal effort.  ABD:  No distention.   ED Results / Procedures / Treatments   Labs (all labs ordered are listed, but only abnormal results are displayed) Labs Reviewed - No data to display    EKG   RADIOLOGY  I personally viewed and evaluated these images as part of my medical decision making, as well as reviewing the written report by the radiologist.  ED Provider Interpretation: concerning LLL consolidation  DG Chest 2 View  Result Date: 08/08/2021 CLINICAL DATA:  Fever EXAM: CHEST - 2 VIEW COMPARISON:  None Available. FINDINGS: There is focal consolidation within the superior  segment of the left lower lobe, likely infectious in the acute setting. Lungs are otherwise clear. No pneumothorax or pleural effusion. Cardiac size within normal limits. Pulmonary vascularity is normal. IMPRESSION: Left lower lobe pneumonia. Follow-up chest radiograph is recommended in 3-4 weeks to document complete resolution. Electronically Signed   By: 08/10/2021 M.D.   On: 08/08/2021 23:14     PROCEDURES:  Critical Care performed: No  Procedures   MEDICATIONS ORDERED IN ED: Medications  azithromycin (ZITHROMAX) tablet 500 mg (500 mg Oral Given 08/09/21 1130)     IMPRESSION / MDM / ASSESSMENT AND PLAN / ED COURSE  I reviewed the triage vital signs and the nursing notes.                              Differential diagnosis includes, but is not limited to, viral URI, COVID, flu, AOM, CAP  Patient's presentation is most consistent with acute complicated illness / injury requiring diagnostic workup.  Patient's diagnosis is consistent with CAP.  Patient is stable on presentation without evidence of tachycardia,  tachypnea, or toxic appearance.  Patient will be discharged home with prescriptions for Azithromycin. Patient is to follow up with his PCP for repeat chest x-ray in 4 weeks, as needed or otherwise directed. Patient is given ED precautions to return to the ED for any worsening or new symptoms.     FINAL CLINICAL IMPRESSION(S) / ED DIAGNOSES   Final diagnoses:  Community acquired pneumonia of left lower lobe of lung     Rx / DC Orders   ED Discharge Orders          Ordered    azithromycin (ZITHROMAX Z-PAK) 250 MG tablet  Daily        08/09/21 1130             Note:  This document was prepared using Dragon voice recognition software and may include unintentional dictation errors.    Lissa Hoard, PA-C 08/09/21 1134    [address], [place]etta Ivory, PA-C 08/09/21 1135    Gilles Chiquito, MD 08/09/21 [phone]
